# Patient Record
Sex: Male | Born: 1960 | Race: Black or African American | Hispanic: No | Smoking: Current every day smoker
Health system: Southern US, Community
[De-identification: ages and names within clinical notes are randomized; demographics above are authoritative.]

## PROBLEM LIST (undated history)

## (undated) DIAGNOSIS — J45909 Unspecified asthma, uncomplicated: Secondary | ICD-10-CM

## (undated) DIAGNOSIS — E119 Type 2 diabetes mellitus without complications: Secondary | ICD-10-CM

## (undated) HISTORY — PX: TOE AMPUTATION: SHX809

---

## 2014-09-27 ENCOUNTER — Encounter (HOSPITAL_COMMUNITY): Payer: Self-pay | Admitting: Emergency Medicine

## 2014-09-27 ENCOUNTER — Emergency Department (INDEPENDENT_AMBULATORY_CARE_PROVIDER_SITE_OTHER)
Admission: EM | Admit: 2014-09-27 | Discharge: 2014-09-27 | Disposition: A | Discharge status: Home / Self Care | Payer: PRIVATE HEALTH INSURANCE | Source: Home / Self Care | Attending: Emergency Medicine | Admitting: Emergency Medicine

## 2014-09-27 DIAGNOSIS — L03032 Cellulitis of left toe: Secondary | ICD-10-CM

## 2014-09-27 HISTORY — DX: Type 2 diabetes mellitus without complications: E11.9

## 2014-09-27 MED ORDER — SULFAMETHOXAZOLE-TRIMETHOPRIM 800-160 MG PO TABS: 2.0000 | ORAL_TABLET | Freq: Two times a day (BID) | ORAL | Status: AC

## 2014-09-27 MED ORDER — CIPROFLOXACIN HCL 750 MG PO TABS: 750.0000 mg | ORAL_TABLET | Freq: Two times a day (BID) | ORAL | Status: DC

## 2014-09-27 MED ORDER — TRAMADOL HCL 50 MG PO TABS: 50.0000 mg | ORAL_TABLET | Freq: Four times a day (QID) | ORAL | Status: DC | PRN

## 2014-09-27 NOTE — ED Provider Notes (Signed)
CSN: [id number]     Arrival date & time 09/27/14  [id number] History   First MD Initiated Contact with Patient 09/27/14 [phone]     Chief Complaint  Patient presents with  . Toe Injury   (Consider location/radiation/quality/duration/timing/severity/associated sxs/prior Treatment) HPI  Marc Miranda is a 54 year old man here for left toe injury. 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 Marc Miranda states over the last 3-4 days Marc Miranda has noticed a blister on the left second toe. Last night, it in the shower the blister ruptured and drained blood and pus. Marc Miranda has also noticed some redness and swelling of the second toe and the medial part of his foot. Marc Miranda denies any fevers or vomiting. Marc Miranda is here from [place], where Marc Miranda does have a doctor and an appointment with podiatry coming up.  Past Medical History  Diagnosis Date  . Diabetes mellitus without complication    History reviewed. No pertinent past surgical history. History reviewed. No pertinent family history. History  Substance Use Topics  . Smoking status: Current Every Day Smoker -- 0.50 packs/day    Types: Cigarettes  . Smokeless tobacco: Not on file  . Alcohol Use: No    Review of Systems As in history of present illness Allergies  Review of patient's allergies indicates no known allergies.  Home Medications   Prior to Admission medications   Medication Sig Start Date End Date Taking? Authorizing Provider  lisinopril (PRINIVIL,ZESTRIL) 10 MG tablet Take 10 mg by mouth daily.   Yes Historical Provider, MD  metFORMIN (GLUCOPHAGE) 1000 MG tablet Take 1,000 mg by mouth 2 (two) times daily with a meal.   Yes Historical Provider, MD  ciprofloxacin (CIPRO) 750 MG tablet Take 1 tablet (750 mg total) by mouth 2 (two) times daily. 09/27/14   Charm RingsErin J Trenell Concannon, MD  sulfamethoxazole-trimethoprim (BACTRIM DS,SEPTRA DS) 800-160 MG per tablet Take 2 tablets by mouth 2 (two) times daily. 09/27/14 10/04/14  Charm RingsErin J Jonta Gastineau, MD  traMADol (ULTRAM) 50 MG tablet Take 1  tablet (50 mg total) by mouth every 6 (six) hours as needed. 09/27/14   Charm RingsErin J Kyrollos Cordell, MD   BP 108/71 mmHg  Pulse 84  Temp(Src) 98.2 F (36.8 C) (Oral)  Resp 16  SpO2 99% Physical Exam  Constitutional: Marc Miranda is oriented to person, place, and time. Marc Miranda appears well-developed and well-nourished. No distress.  Cardiovascular: Normal rate.   Pulmonary/Chest: Effort normal.  Musculoskeletal:  Left foot: Great toe amputation. Second toe is erythematous, swollen, warm. There is a chronic appearing ulcer at the tip of the second toe. Marc Miranda has an open wound on the dorsal aspect of the second toe. It is not actively draining at this time. The erythema extends over the second metatarsal area.  Neurological: Marc Miranda is alert and oriented to person, place, and time.    ED Course  Procedures (including critical care time) Labs Review Labs Reviewed - No data to display  Imaging Review No results found.   MDM   1. Cellulitis of second toe of left foot    Given his diabetes, will cover with Bactrim and Cipro. Tramadol for pain. Wound care instructions as in after visit summary. Strict return precautions reviewed as in after visit summary.    Charm RingsErin J Koralynn Greenspan, MD 09/27/14 [phone]

## 2014-09-27 NOTE — ED Notes (Signed)
Pt states that he has a blister on his great left toe that busted yesterday when he was in the shower.

## 2014-09-27 NOTE — Discharge Instructions (Signed)
You have an infection in your toe. Take Bactrim 2 pills twice a day for the next 10 days. Take Cipro 1 pill twice a day for the next 10 days. Wash the toe and foot with soap and warm water twice a day.  Apply Neosporin and cover with a bandage for the next 3 days. If it is not improving in the next 2 days, or the redness is spreading to your ankle and leg, or you develop fevers, please go to the emergency room. Follow-up with your doctor when you get back to [place].

## 2014-10-15 ENCOUNTER — Emergency Department (HOSPITAL_COMMUNITY)
Admission: EM | Admit: 2014-10-15 | Discharge: 2014-10-15 | Disposition: A | Discharge status: Home / Self Care | Payer: Medicaid - Out of State | Attending: Emergency Medicine | Admitting: Emergency Medicine

## 2014-10-15 ENCOUNTER — Encounter (HOSPITAL_COMMUNITY): Payer: Self-pay | Admitting: Emergency Medicine

## 2014-10-15 DIAGNOSIS — M545 Low back pain: Secondary | ICD-10-CM | POA: Diagnosis present

## 2014-10-15 DIAGNOSIS — G8911 Acute pain due to trauma: Secondary | ICD-10-CM | POA: Diagnosis not present

## 2014-10-15 DIAGNOSIS — Z720 Tobacco use: Secondary | ICD-10-CM | POA: Diagnosis not present

## 2014-10-15 DIAGNOSIS — Z792 Long term (current) use of antibiotics: Secondary | ICD-10-CM | POA: Diagnosis not present

## 2014-10-15 DIAGNOSIS — Z79899 Other long term (current) drug therapy: Secondary | ICD-10-CM | POA: Diagnosis not present

## 2014-10-15 DIAGNOSIS — M5432 Sciatica, left side: Secondary | ICD-10-CM | POA: Insufficient documentation

## 2014-10-15 DIAGNOSIS — E119 Type 2 diabetes mellitus without complications: Secondary | ICD-10-CM | POA: Insufficient documentation

## 2014-10-15 MED ORDER — IBUPROFEN 800 MG PO TABS: 800.0000 mg | ORAL_TABLET | Freq: Three times a day (TID) | ORAL | Status: AC

## 2014-10-15 MED ORDER — CYCLOBENZAPRINE HCL 10 MG PO TABS: 10.0000 mg | ORAL_TABLET | Freq: Two times a day (BID) | ORAL | Status: DC | PRN
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
## 2014-10-15 NOTE — Discharge Instructions (Signed)
Sciatica °Sciatica is pain, weakness, numbness, or tingling along your sciatic nerve. The nerve starts in the lower back and runs down the back of each leg. Nerve damage or certain conditions pinch or put pressure on the sciatic nerve. This causes the pain, weakness, and other discomforts of sciatica. °HOME CARE  °· Only take medicine as told by your doctor. °· Apply ice to the affected area for 20 minutes. Do this 3-4 times a day for the first 48-72 hours. Then try heat in the same way. °· Exercise, stretch, or do your usual activities if these do not make your pain worse. °· Go to physical therapy as told by your doctor. °· Keep all doctor visits as told. °· Do not wear high heels or shoes that are not supportive. °· Get a firm mattress if your mattress is too soft to lessen pain and discomfort. °GET HELP RIGHT AWAY IF:  °· You cannot control when you poop (bowel movement) or pee (urinate). °· You have more weakness in your lower back, lower belly (pelvis), butt (buttocks), or legs. °· You have redness or puffiness (swelling) of your back. °· You have a burning feeling when you pee. °· You have pain that gets worse when you lie down. °· You have pain that wakes you from your sleep. °· Your pain is worse than past pain. °· Your pain lasts longer than 4 weeks. °· You are suddenly losing weight without reason. °MAKE SURE YOU:  °· Understand these instructions. °· Will watch this condition. °· Will get help right away if you are not doing well or get worse. °Document Released: 01/27/2008 Document Revised: 10/19/2011 Document Reviewed: 08/29/2011 °Exit[employer]® Patient Information ©2015 [employer]. This information is not intended to replace advice given to you by your health care provider. Make sure you discuss any questions you have with your health care provider. ° °
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
## 2014-12-03 ENCOUNTER — Other Ambulatory Visit (HOSPITAL_COMMUNITY): Payer: Self-pay | Admitting: Orthopedic Surgery

## 2014-12-03 ENCOUNTER — Inpatient Hospital Stay (HOSPITAL_COMMUNITY)
Admission: EM | Admit: 2014-12-03 | Discharge: 2014-12-05 | DRG: 617 | Disposition: A | Discharge status: Home / Self Care | Payer: Medicaid - Out of State | Attending: Family Medicine | Admitting: Family Medicine

## 2014-12-03 ENCOUNTER — Encounter (HOSPITAL_COMMUNITY): Payer: Self-pay | Admitting: Emergency Medicine

## 2014-12-03 ENCOUNTER — Ambulatory Visit (HOSPITAL_COMMUNITY): Payer: Medicaid - Out of State

## 2014-12-03 ENCOUNTER — Emergency Department (HOSPITAL_COMMUNITY): Payer: Medicaid - Out of State

## 2014-12-03 DIAGNOSIS — E11621 Type 2 diabetes mellitus with foot ulcer: Principal | ICD-10-CM | POA: Diagnosis present

## 2014-12-03 DIAGNOSIS — E1142 Type 2 diabetes mellitus with diabetic polyneuropathy: Secondary | ICD-10-CM

## 2014-12-03 DIAGNOSIS — Z794 Long term (current) use of insulin: Secondary | ICD-10-CM

## 2014-12-03 DIAGNOSIS — E1169 Type 2 diabetes mellitus with other specified complication: Secondary | ICD-10-CM | POA: Diagnosis present

## 2014-12-03 DIAGNOSIS — L97529 Non-pressure chronic ulcer of other part of left foot with unspecified severity: Secondary | ICD-10-CM | POA: Diagnosis present

## 2014-12-03 DIAGNOSIS — M869 Osteomyelitis, unspecified: Secondary | ICD-10-CM | POA: Diagnosis present

## 2014-12-03 DIAGNOSIS — J45909 Unspecified asthma, uncomplicated: Secondary | ICD-10-CM | POA: Diagnosis present

## 2014-12-03 DIAGNOSIS — M7732 Calcaneal spur, left foot: Secondary | ICD-10-CM | POA: Diagnosis present

## 2014-12-03 DIAGNOSIS — L03032 Cellulitis of left toe: Secondary | ICD-10-CM | POA: Diagnosis present

## 2014-12-03 DIAGNOSIS — I10 Essential (primary) hypertension: Secondary | ICD-10-CM | POA: Diagnosis present

## 2014-12-03 DIAGNOSIS — L03119 Cellulitis of unspecified part of limb: Secondary | ICD-10-CM | POA: Diagnosis not present

## 2014-12-03 DIAGNOSIS — E1140 Type 2 diabetes mellitus with diabetic neuropathy, unspecified: Secondary | ICD-10-CM | POA: Diagnosis present

## 2014-12-03 DIAGNOSIS — F17210 Nicotine dependence, cigarettes, uncomplicated: Secondary | ICD-10-CM | POA: Diagnosis present

## 2014-12-03 DIAGNOSIS — L0390 Cellulitis, unspecified: Secondary | ICD-10-CM | POA: Diagnosis present

## 2014-12-03 DIAGNOSIS — I96 Gangrene, not elsewhere classified: Secondary | ICD-10-CM | POA: Diagnosis present

## 2014-12-03 DIAGNOSIS — Z899 Acquired absence of limb, unspecified: Secondary | ICD-10-CM | POA: Diagnosis not present

## 2014-12-03 DIAGNOSIS — E119 Type 2 diabetes mellitus without complications: Secondary | ICD-10-CM

## 2014-12-03 DIAGNOSIS — L97509 Non-pressure chronic ulcer of other part of unspecified foot with unspecified severity: Secondary | ICD-10-CM

## 2014-12-03 HISTORY — DX: Unspecified asthma, uncomplicated: J45.909

## 2014-12-03 LAB — CBC WITH DIFFERENTIAL/PLATELET
BASOS ABS: 0.1 10*3/uL (ref 0.0–0.1)
Basophils Absolute: 0.1 10*3/uL (ref 0.0–0.1)
Basophils Relative: 2 % — ABNORMAL HIGH (ref 0–1)
Basophils Relative: 2 % — ABNORMAL HIGH (ref 0–1)
Eosinophils Absolute: 0.2 10*3/uL (ref 0.0–0.7)
Eosinophils Absolute: 0.2 10*3/uL (ref 0.0–0.7)
Eosinophils Relative: 3 % (ref 0–5)
Eosinophils Relative: 4 % (ref 0–5)
HCT: 37.1 % — ABNORMAL LOW (ref 39.0–52.0)
HEMATOCRIT: 35.4 % — AB (ref 39.0–52.0)
HEMOGLOBIN: 12.4 g/dL — AB (ref 13.0–17.0)
Hemoglobin: 11.3 g/dL — ABNORMAL LOW (ref 13.0–17.0)
LYMPHS ABS: 2.7 10*3/uL (ref 0.7–4.0)
LYMPHS ABS: 2.6 10*3/uL (ref 0.7–4.0)
LYMPHS PCT: 43 % (ref 12–46)
Lymphocytes Relative: 38 % (ref 12–46)
MCH: 23.2 pg — ABNORMAL LOW (ref 26.0–34.0)
MCH: 22.3 pg — AB (ref 26.0–34.0)
MCHC: 33.4 g/dL (ref 30.0–36.0)
MCHC: 31.9 g/dL (ref 30.0–36.0)
MCV: 69.5 fL — ABNORMAL LOW (ref 78.0–100.0)
MCV: 70 fL — AB (ref 78.0–100.0)
Monocytes Absolute: 0.6 10*3/uL (ref 0.1–1.0)
Monocytes Absolute: 0.5 10*3/uL (ref 0.1–1.0)
Monocytes Relative: 9 % (ref 3–12)
Monocytes Relative: 9 % (ref 3–12)
NEUTROS PCT: 48 % (ref 43–77)
Neutro Abs: 3.5 10*3/uL (ref 1.7–7.7)
Neutro Abs: 2.7 10*3/uL (ref 1.7–7.7)
Neutrophils Relative %: 42 % — ABNORMAL LOW (ref 43–77)
PLATELETS: 168 10*3/uL (ref 150–400)
Platelets: 170 10*3/uL (ref 150–400)
RBC: 5.34 MIL/uL (ref 4.22–5.81)
RBC: 5.06 MIL/uL (ref 4.22–5.81)
RDW: 14.7 % (ref 11.5–15.5)
RDW: 14.8 % (ref 11.5–15.5)
WBC: 7.1 10*3/uL (ref 4.0–10.5)
WBC: 6.1 10*3/uL (ref 4.0–10.5)

## 2014-12-03 LAB — GLUCOSE, CAPILLARY
GLUCOSE-CAPILLARY: 294 mg/dL — AB (ref 65–99)
Glucose-Capillary: 311 mg/dL — ABNORMAL HIGH (ref 65–99)
Glucose-Capillary: 249 mg/dL — ABNORMAL HIGH (ref 65–99)
Glucose-Capillary: 408 mg/dL — ABNORMAL HIGH (ref 65–99)

## 2014-12-03 LAB — HEPATIC FUNCTION PANEL
ALT: 47 U/L (ref 17–63)
AST: 27 U/L (ref 15–41)
Albumin: 3.1 g/dL — ABNORMAL LOW (ref 3.5–5.0)
Alkaline Phosphatase: 95 U/L (ref 38–126)
Bilirubin, Direct: <0.1 mg/dL — ABNORMAL LOW (ref 0.1–0.5)
Total Bilirubin: 0.4 mg/dL (ref 0.3–1.2)
Total Protein: 7.3 g/dL (ref 6.5–8.1)

## 2014-12-03 LAB — BASIC METABOLIC PANEL
Anion gap: 8 (ref 5–15)
BUN: 21 mg/dL — ABNORMAL HIGH (ref 6–20)
CO2: 25 mmol/L (ref 22–32)
Calcium: 9.1 mg/dL (ref 8.9–10.3)
Chloride: 100 mmol/L — ABNORMAL LOW (ref 101–111)
Creatinine, Ser: 1.15 mg/dL (ref 0.61–1.24)
GFR calc Af Amer: >60 mL/min (ref >60)
GFR calc non Af Amer: >60 mL/min (ref >60)
Glucose, Bld: 456 mg/dL — ABNORMAL HIGH (ref 65–99)
Potassium: 5.2 mmol/L — ABNORMAL HIGH (ref 3.5–5.1)
SODIUM: 133 mmol/L — AB (ref 135–145)

## 2014-12-03 LAB — CREATININE, SERUM
Creatinine, Ser: 1 mg/dL (ref 0.61–1.24)
GFR calc Af Amer: >60 mL/min (ref >60)
GFR calc non Af Amer: >60 mL/min (ref >60)

## 2014-12-03 LAB — HIV ANTIBODY (ROUTINE TESTING W REFLEX): HIV Screen 4th Generation wRfx: NONREACTIVE

## 2014-12-03 LAB — COMPREHENSIVE METABOLIC PANEL
ALK PHOS: 75 U/L (ref 38–126)
ALT: 44 U/L (ref 17–63)
AST: 24 U/L (ref 15–41)
Albumin: 2.9 g/dL — ABNORMAL LOW (ref 3.5–5.0)
Anion gap: 6 (ref 5–15)
BILIRUBIN TOTAL: 0.3 mg/dL (ref 0.3–1.2)
BUN: 16 mg/dL (ref 6–20)
CO2: 28 mmol/L (ref 22–32)
CREATININE: 1.09 mg/dL (ref 0.61–1.24)
Calcium: 9 mg/dL (ref 8.9–10.3)
Chloride: 102 mmol/L (ref 101–111)
GFR calc Af Amer: >60 mL/min (ref >60)
GFR calc non Af Amer: >60 mL/min (ref >60)
Glucose, Bld: 387 mg/dL — ABNORMAL HIGH (ref 65–99)
POTASSIUM: 4.8 mmol/L (ref 3.5–5.1)
SODIUM: 136 mmol/L (ref 135–145)
Total Protein: 6.5 g/dL (ref 6.5–8.1)

## 2014-12-03 LAB — PREALBUMIN: Prealbumin: 20.8 mg/dL (ref 18–38)

## 2014-12-03 LAB — PROTIME-INR
INR: 1.04 (ref 0.00–1.49)
INR: 1.11 (ref 0.00–1.49)
Prothrombin Time: 13.8 seconds (ref 11.6–15.2)
Prothrombin Time: 14.5 seconds (ref 11.6–15.2)

## 2014-12-03 LAB — TYPE AND SCREEN
ABO/RH(D): O NEG
Antibody Screen: NEGATIVE

## 2014-12-03 LAB — CK: Total CK: 221 U/L (ref 49–397)

## 2014-12-03 LAB — SEDIMENTATION RATE: Sed Rate: 49 mm/hr — ABNORMAL HIGH (ref 0–16)

## 2014-12-03 LAB — ABO/RH: ABO/RH(D): O NEG

## 2014-12-03 LAB — C-REACTIVE PROTEIN: CRP: 7.1 mg/dL — ABNORMAL HIGH (ref <1.0)

## 2014-12-03 MED ORDER — PRO-STAT SUGAR FREE PO LIQD
30.0000 mL | Freq: Every day | ORAL | Status: DC
  Administered 2014-12-03 – 2014-12-05 (×2): 30 mL via ORAL
  Filled 2014-12-03 (×2): qty 30

## 2014-12-03 MED ORDER — VANCOMYCIN HCL 10 G IV SOLR
1500.0000 mg | Freq: Once | INTRAVENOUS | Status: AC
  Administered 2014-12-04: 1500 mg via INTRAVENOUS
  Filled 2014-12-03 (×2): qty 1500

## 2014-12-03 MED ORDER — INSULIN ASPART 100 UNIT/ML ~~LOC~~ SOLN
0.0000 [IU] | Freq: Three times a day (TID) | SUBCUTANEOUS | Status: DC
  Administered 2014-12-03: 5 [IU] via SUBCUTANEOUS
  Administered 2014-12-03: 8 [IU] via SUBCUTANEOUS
  Administered 2014-12-03: 11 [IU] via SUBCUTANEOUS
  Administered 2014-12-04: 5 [IU] via SUBCUTANEOUS
  Administered 2014-12-04: 8 [IU] via SUBCUTANEOUS
  Administered 2014-12-05: 3 [IU] via SUBCUTANEOUS
  Administered 2014-12-05: 5 [IU] via SUBCUTANEOUS

## 2014-12-03 MED ORDER — ACETAMINOPHEN 650 MG RE SUPP: 650.0000 mg | Freq: Four times a day (QID) | RECTAL | Status: DC | PRN

## 2014-12-03 MED ORDER — SODIUM POLYSTYRENE SULFONATE 15 GM/60ML PO SUSP
30.0000 g | Freq: Once | ORAL | Status: AC
  Administered 2014-12-03: 30 g via ORAL
  Filled 2014-12-03: qty 120

## 2014-12-03 MED ORDER — PIPERACILLIN-TAZOBACTAM 3.375 G IVPB 30 MIN
3.3750 g | INTRAVENOUS | Status: AC
  Filled 2014-12-03: qty 50

## 2014-12-03 MED ORDER — HYDROMORPHONE HCL 1 MG/ML IJ SOLN
0.5000 mg | INTRAMUSCULAR | Status: DC | PRN
  Administered 2014-12-03 – 2014-12-05 (×8): 0.5 mg via INTRAVENOUS
  Filled 2014-12-03 (×8): qty 1

## 2014-12-03 MED ORDER — ONDANSETRON HCL 4 MG/2ML IJ SOLN
4.0000 mg | Freq: Four times a day (QID) | INTRAMUSCULAR | Status: DC | PRN
  Administered 2014-12-04: 4 mg via INTRAVENOUS

## 2014-12-03 MED ORDER — INSULIN ASPART 100 UNIT/ML ~~LOC~~ SOLN
0.0000 [IU] | Freq: Every day | SUBCUTANEOUS | Status: DC
  Administered 2014-12-03: 5 [IU] via SUBCUTANEOUS
  Administered 2014-12-04: 3 [IU] via SUBCUTANEOUS

## 2014-12-03 MED ORDER — OXYCODONE HCL 5 MG PO TABS
5.0000 mg | ORAL_TABLET | ORAL | Status: DC | PRN
  Administered 2014-12-03 – 2014-12-05 (×6): 5 mg via ORAL
  Filled 2014-12-03 (×7): qty 1

## 2014-12-03 MED ORDER — ONDANSETRON HCL 4 MG PO TABS
4.0000 mg | ORAL_TABLET | Freq: Four times a day (QID) | ORAL | Status: DC | PRN
Start: 2014-12-03 — End: 2014-12-04

## 2014-12-03 MED ORDER — HYDROCODONE-ACETAMINOPHEN 10-325 MG PO TABS
1.0000 | ORAL_TABLET | Freq: Four times a day (QID) | ORAL | Status: DC | PRN
  Administered 2014-12-03: 2 via ORAL
  Filled 2014-12-03: qty 2

## 2014-12-03 MED ORDER — PANTOPRAZOLE SODIUM 40 MG PO TBEC
40.0000 mg | DELAYED_RELEASE_TABLET | Freq: Every day | ORAL | Status: DC
  Administered 2014-12-03 – 2014-12-05 (×3): 40 mg via ORAL
  Filled 2014-12-03 (×3): qty 1

## 2014-12-03 MED ORDER — INSULIN GLARGINE 100 UNIT/ML ~~LOC~~ SOLN
60.0000 [IU] | Freq: Every day | SUBCUTANEOUS | Status: DC
  Administered 2014-12-03 – 2014-12-04 (×2): 60 [IU] via SUBCUTANEOUS
  Filled 2014-12-03 (×3): qty 0.6

## 2014-12-03 MED ORDER — ENOXAPARIN SODIUM 40 MG/0.4ML ~~LOC~~ SOLN
40.0000 mg | SUBCUTANEOUS | Status: DC
  Administered 2014-12-03 – 2014-12-05 (×2): 40 mg via SUBCUTANEOUS
  Filled 2014-12-03 (×2): qty 0.4

## 2014-12-03 MED ORDER — GLUCERNA SHAKE PO LIQD
237.0000 mL | Freq: Three times a day (TID) | ORAL | Status: DC
  Administered 2014-12-03 – 2014-12-05 (×5): 237 mL via ORAL

## 2014-12-03 MED ORDER — ACETAMINOPHEN 325 MG PO TABS: 650.0000 mg | ORAL_TABLET | Freq: Four times a day (QID) | ORAL | Status: DC | PRN

## 2014-12-03 MED ORDER — PIPERACILLIN-TAZOBACTAM 4.5 G IVPB: 4.5000 g | Freq: Once | INTRAVENOUS | Status: DC

## 2014-12-03 NOTE — Progress Notes (Signed)
Initial Nutrition Assessment  DOCUMENTATION CODES:   Obesity unspecified  INTERVENTION:  Provide Glucerna Shake po TID, each supplement provides 220 kcal and 10 grams of protein.  Provide 30 ml Prostat po once daily, each supplement  Provides 100 kcal and 15 grams of protein.   NUTRITION DIAGNOSIS:   Increased nutrient needs related to wound healing as evidenced by estimated needs.  GOAL:   Patient will meet greater than or equal to 90% of their needs  MONITOR:   PO intake, Supplement acceptance, Weight trends, Labs, I & O's  REASON FOR ASSESSMENT:   Consult Wound healing  ASSESSMENT:   54 y.o. male with Past medical history of diabetes mellitus, hypertension, anemia, neuropathy, amputation of the left great toe. The patient is presenting with complaints of nonhealing ulcer of the left second and third toe.  Pt reports having a decreased appetite since admission. No percent meal completion recorded, however pt reports a 50% intake. PTA pt reports eating well with 3 meals daily and no other difficulties. Pt is agreeable to nutritional supplements to aid in wound healing. RD to order. Pt was encouraged to eat his food at meals.  Pt with no observed significant fat or muscle mass loss.  Labs and medications reviewed.   Diet Order:  Diet heart healthy/carb modified Room service appropriate?: Yes; Fluid consistency:: Thin  Skin:  Wound (see comment) (on L toe, +2 LLE edema)  Last BM:  7/29  Height:   Ht Readings from Last 1 Encounters:  12/03/14  (1.93 m)    Weight:   Wt Readings from Last 1 Encounters:  12/03/14 295 lb (133.811 kg)    Ideal Body Weight:  91.8 kg  BMI:  Body mass index is 35.92 kg/(m^2).  Estimated Nutritional Needs:   Kcal:  2300-2500  Protein:  130-145 grams  Fluid:  2.3 - 2.5 L/day  EDUCATION NEEDS:   No education needs identified at this time  Roslyn Smiling, MS, RD, LDN Pager # [phone] After hours/ weekend pager #  [phone]

## 2014-12-03 NOTE — Progress Notes (Signed)
                PATIENT DETAILS Name: Marc Miranda Age: 54 y.o. Sex: male Date of Birth: 10-Sep-1960 Admit Date: 12/03/2014 Admitting Physician Rolly Salter, MD PCP:No PCP Per Patient  Patient seen and examined. Admitted earlier this morning by Dr. Allena Katz. Agree with plans outlined by Dr. Allena Katz. Have consulted Dr. Alfredo Batty patient would likely need an amputation of his second toe of his left foot. In the meantime continue with wet-to-dry dressings, continue empiric antibiotics.  Windell Norfolk MD

## 2014-12-03 NOTE — ED Notes (Signed)
Pt. reports worsening left middle toe infection with drainage and swelling onset last week , denies fever or chills.

## 2014-12-03 NOTE — ED Notes (Signed)
Admitting at bedside 

## 2014-12-03 NOTE — ED Notes (Signed)
Attempted report x1. 

## 2014-12-03 NOTE — Consult Note (Signed)
Reason for Consult: Osteomyelitis ulceration and cellulitis left forefoot Referring Physician: Dr Jari Favre is an 54 y.o. male.  HPI: Patient is a 54 year old gentleman diabetic insensate neuropathy who presents with ulceration necrosis cellulitis left foot third toe   Past Medical History  Diagnosis Date  . Diabetes mellitus without complication   . Asthma     Past Surgical History  Procedure Laterality Date  . Toe amputation      History reviewed. No pertinent family history.  Social History:  reports that he has been smoking Cigarettes.  He has a 10 pack-year smoking history. He has never used smokeless tobacco. He reports that he does not drink alcohol or use illicit drugs.  Allergies: No Known Allergies  Medications: I have reviewed the patient's current medications.  Results for orders placed or performed during the hospital encounter of 12/03/14 (from the past 48 hour(s))  Sedimentation rate     Status: Abnormal   Collection Time: 12/03/14  1:52 AM  Result Value Ref Range   Sed Rate 49 (H) 0 - 16 mm/hr  C-reactive protein     Status: Abnormal   Collection Time: 12/03/14  1:52 AM  Result Value Ref Range   CRP 7.1 (H) <1.0 mg/dL  CBC with Differential/Platelet     Status: Abnormal   Collection Time: 12/03/14  1:52 AM  Result Value Ref Range   WBC 7.1 4.0 - 10.5 K/uL   RBC 5.34 4.22 - 5.81 MIL/uL   Hemoglobin 12.4 (L) 13.0 - 17.0 g/dL   HCT 37.1 (L) 39.0 - 52.0 %   MCV 69.5 (L) 78.0 - 100.0 fL   MCH 23.2 (L) 26.0 - 34.0 pg   MCHC 33.4 30.0 - 36.0 g/dL   RDW 14.7 11.5 - 15.5 %   Platelets 170 150 - 400 K/uL   Neutrophils Relative % 48 43 - 77 %   Lymphocytes Relative 38 12 - 46 %   Monocytes Relative 9 3 - 12 %   Eosinophils Relative 3 0 - 5 %   Basophils Relative 2 (H) 0 - 1 %   Neutro Abs 3.5 1.7 - 7.7 K/uL   Lymphs Abs 2.7 0.7 - 4.0 K/uL   Monocytes Absolute 0.6 0.1 - 1.0 K/uL   Eosinophils Absolute 0.2 0.0 - 0.7 K/uL   Basophils Absolute 0.1  0.0 - 0.1 K/uL   RBC Morphology TEARDROP CELLS     Comment: ELLIPTOCYTES   WBC Morphology ATYPICAL LYMPHOCYTES   Basic metabolic panel     Status: Abnormal   Collection Time: 12/03/14  1:52 AM  Result Value Ref Range   Sodium 133 (L) 135 - 145 mmol/L   Potassium 5.2 (H) 3.5 - 5.1 mmol/L   Chloride 100 (L) 101 - 111 mmol/L   CO2 25 22 - 32 mmol/L   Glucose, Bld 456 (H) 65 - 99 mg/dL   BUN 21 (H) 6 - 20 mg/dL   Creatinine, Ser 1.15 0.61 - 1.24 mg/dL   Calcium 9.1 8.9 - 10.3 mg/dL   GFR calc non Af Amer >60 >60 mL/min   GFR calc Af Amer >60 >60 mL/min    Comment: (NOTE) The eGFR has been calculated using the CKD EPI equation. This calculation has not been validated in all clinical situations. eGFR's persistently <60 mL/min signify possible Chronic Kidney Disease.    Anion gap 8 5 - 15  HIV antibody     Status: None   Collection Time: 12/03/14  7:15 AM  Result Value Ref Range   HIV Screen 4th Generation wRfx Non Reactive Non Reactive    Comment: (NOTE) Performed At: [facility] [place], [place] [id number] Lindon Romp MD WN:[id number]   Prealbumin     Status: None   Collection Time: 12/03/14  7:15 AM  Result Value Ref Range   Prealbumin 20.8 18 - 38 mg/dL  Blood Cultures x 2 sites     Status: None (Preliminary result)   Collection Time: 12/03/14  7:15 AM  Result Value Ref Range   Specimen Description BLOOD RIGHT ARM    Special Requests BOTTLES DRAWN AEROBIC AND ANAEROBIC 10CC    Culture PENDING    Report Status PENDING   Creatinine, serum     Status: None   Collection Time: 12/03/14  7:15 AM  Result Value Ref Range   Creatinine, Ser 1.00 0.61 - 1.24 mg/dL   GFR calc non Af Amer >60 >60 mL/min   GFR calc Af Amer >60 >60 mL/min    Comment: (NOTE) The eGFR has been calculated using the CKD EPI equation. This calculation has not been validated in all clinical situations. eGFR's persistently <60 mL/min signify possible Chronic Kidney Disease.    LFT's     Status: Abnormal   Collection Time: 12/03/14  7:15 AM  Result Value Ref Range   Total Protein 7.3 6.5 - 8.1 g/dL   Albumin 3.1 (L) 3.5 - 5.0 g/dL   AST 27 15 - 41 U/L   ALT 47 17 - 63 U/L   Alkaline Phosphatase 95 38 - 126 U/L   Total Bilirubin 0.4 0.3 - 1.2 mg/dL   Bilirubin, Direct <0.1 (L) 0.1 - 0.5 mg/dL   Indirect Bilirubin NOT CALCULATED 0.3 - 0.9 mg/dL  Blood Cultures x 2 sites     Status: None (Preliminary result)   Collection Time: 12/03/14  7:23 AM  Result Value Ref Range   Specimen Description BLOOD RIGHT ARM    Special Requests BOTTLES DRAWN AEROBIC AND ANAEROBIC 10CC    Culture PENDING    Report Status PENDING   CK     Status: None   Collection Time: 12/03/14  7:23 AM  Result Value Ref Range   Total CK 221 49 - 397 U/L  Protime-INR     Status: None   Collection Time: 12/03/14  7:23 AM  Result Value Ref Range   Prothrombin Time 13.8 11.6 - 15.2 seconds   INR 1.04 0.00 - 1.49  Glucose, capillary     Status: Abnormal   Collection Time: 12/03/14  7:42 AM  Result Value Ref Range   Glucose-Capillary 294 (H) 65 - 99 mg/dL  Glucose, capillary     Status: Abnormal   Collection Time: 12/03/14 12:27 PM  Result Value Ref Range   Glucose-Capillary 311 (H) 65 - 99 mg/dL  Glucose, capillary     Status: Abnormal   Collection Time: 12/03/14  5:34 PM  Result Value Ref Range   Glucose-Capillary 249 (H) 65 - 99 mg/dL    Dg Foot Complete Left  12/03/2014   CLINICAL DATA:  Acute onset of left foot pain and erythema. Initial encounter.  EXAM: LEFT FOOT - COMPLETE 3+ VIEW  COMPARISON:  None.  FINDINGS: The patient is status post amputation of much of the first metatarsal. Degenerative change is noted at the second and third metatarsophalangeal joints. There is chronic resorption involving the distal aspects of the second and third toes. Would correlate clinically for any evidence of  underlying osteomyelitis. Associated soft tissue swelling is noted about the second and third  toes.  The visualized joint spaces are grossly preserved. An os peroneum is noted. Plantar and posterior calcaneal spurs are incidentally seen.  IMPRESSION: 1. Chronic resorption involving the distal aspects of the second and third toes. Would correlate clinically for any evidence of underlying osteomyelitis. 2. Status post amputation of much of the first metatarsal. Degenerative change at the second and third metatarsophalangeal joints. 3. Os peroneum noted.   Electronically Signed   By: Garald Balding M.D.   On: 12/03/2014 02:55    Review of Systems  All other systems reviewed and are negative.  Blood pressure 120/80, pulse 72, temperature 97.8 F (36.6 C), temperature source Oral, resp. rate 18, height $RemoveBe'6\' 4"'KWKaRbcOI$  (1.93 m), weight 133.811 kg (295 lb), SpO2 98 %. Physical Exam On examination patient is a good dorsalis pedis pulse on the left. He has swelling involving the foot and leg secondary to the forefoot infection. There is cellulitis involving the entire forefoot with necrotic gangrenous third toe with exposed bone. Patient is also status post a left foot first ray amputation. He has fixed clawing of the lesser toes. Assessment/Plan: Assessment: Cellulitis ulceration osteomyelitis left forefoot status post first ray amputation with fixed clawing of his toes. I discussed the patient feel is best option is a transmetatarsal amputation. I do not feel there is sufficient soft tissue envelope to heal an amputation of the third toe and with the fixed clawing of the second and fourth toes patient would quickly break these down and require additional surgery. I am concerned with the cellulitis extending into the MTP joint region and the infection may extend more proximal than it appears. Discussed that we will try to save as much of his foot as we can with a transmetatarsal amputation. Risks and benefits were discussed including the risk of the wound not healing. Patient states he understands wishes to proceed  at this time.  Ronon Ferger V 12/03/2014, 6:01 PM

## 2014-12-03 NOTE — Consult Note (Signed)
WOC consult requested for left foot, but EMR indicates that ortho service has been consulted and their recommendations are pending. X-ray indicates osteomyelitis; this complex medical condition is beyond WOC scope of practice.  Please refer to ortho service for further questions. Please re-consult if further assistance is needed.  Thank-you,  Cammie Mcgee MSN, RN, CWOCN, [place], CNS [phone]

## 2014-12-03 NOTE — ED Provider Notes (Signed)
CSN: [id number]     Arrival date & time 12/03/14  0112 History   This chart was scribed for Marisa Severin, MD by Doreatha Martin, ED Scribe. This patient was seen in room D32C/D32C and the patient's care was started at 2:13 AM.     Chief Complaint  Patient presents with  . Wound Infection   The history is provided by the patient. No language interpreter was used.    HPI Comments: Atreyu Mak is a 54 y.o. male with Hx of DM, asthma who presents to the Emergency Department complaining of a moderate, gradually worsening wound infection on the left middle toe onset 4 days ago. He states associated fever 4 days ago, pain, redness and swelling. Pt states that he noticed the open wound after a shower 4 days ago while in [place], [place] and reports that the wound worsened later that day. He was then seen at [facility] in [place] where he was admitted for 3 days, Dx with osteomyelitis of the left middle toe and prescribed Cipro, clinda, Zosyn and Vancomycin. Pt states that he lives in [place]. He has a Hx of left big toe amputation 8 years ago. He denies other symptoms.  He was recommended amputation of the third toe due to osteomyelitis.  He refused.  Patient left the hospital in [place] to travel back to [place], but stopped in [place] secondary to worsening pain, redness and swelling.  Past Medical History  Diagnosis Date  . Diabetes mellitus without complication   . Asthma    Past Surgical History  Procedure Laterality Date  . Toe amputation     No family history on file. History  Substance Use Topics  . Smoking status: Current Every Day Smoker -- 0.00 packs/day    Types: Cigarettes  . Smokeless tobacco: Not on file  . Alcohol Use: No    Review of Systems  Constitutional: Positive for fever.  Musculoskeletal: Positive for joint swelling and arthralgias.  Skin: Positive for wound.  All other systems reviewed and are negative.  Allergies  Review of patient's allergies  indicates no known allergies.  Home Medications   Prior to Admission medications   Medication Sig Start Date End Date Taking? Authorizing Provider  ciprofloxacin (CIPRO) 750 MG tablet Take 1 tablet (750 mg total) by mouth 2 (two) times daily. 09/27/14   Charm Rings, MD  cyclobenzaprine (FLEXERIL) 10 MG tablet Take 1 tablet (10 mg total) by mouth 2 (two) times daily as needed for muscle spasms. 10/15/14   Teressa Lower, NP  ibuprofen (ADVIL,MOTRIN) 800 MG tablet Take 1 tablet (800 mg total) by mouth 3 (three) times daily. 10/15/14   Teressa Lower, NP  lisinopril (PRINIVIL,ZESTRIL) 10 MG tablet Take 10 mg by mouth daily.    Historical Provider, MD  metFORMIN (GLUCOPHAGE) 1000 MG tablet Take 1,000 mg by mouth 2 (two) times daily with a meal.    Historical Provider, MD  traMADol (ULTRAM) 50 MG tablet Take 1 tablet (50 mg total) by mouth every 6 (six) hours as needed. 09/27/14   Charm Rings, MD   BP 149/109 mmHg  Pulse 94  Temp(Src) 98.2 F (36.8 C) (Oral)  Resp 20  SpO2 95% Physical Exam  Constitutional: He is oriented to person, place, and time. He appears well-developed and well-nourished.  HENT:  Head: Normocephalic and atraumatic.  Eyes: Conjunctivae and EOM are normal. Pupils are equal, round, and reactive to light.  Neck: Normal range of motion. Neck supple.  Cardiovascular: Normal rate.   Pulmonary/Chest: Effort normal. No respiratory distress.  Abdominal: He exhibits no distension.  Musculoskeletal: Normal range of motion.  Patient has sloughing of the skin to the third toe with unroofed blister to the dorsal aspect of the foot.  He has erythema extending to the mid foot.  There are ulcerations and early gangrene to the bottom side of the third toe.  Second and fourth toe are also erythematous and inflamed.  There is no crepitus.  Please see pictures for better illustration.  Neurological: He is alert and oriented to person, place, and time.  Skin: Skin is warm and dry.   Psychiatric: He has a normal mood and affect. His behavior is normal.  Nursing note and vitals reviewed.   ED Course  Procedures (including critical care time) DIAGNOSTIC STUDIES: Oxygen Saturation is 95% on RA, adequate by my interpretation.    COORDINATION OF CARE: 2:25 AM Discussed treatment plan with pt at bedside and pt agreed to plan.          Labs Review Labs Reviewed  SEDIMENTATION RATE - Abnormal; Notable for the following:    Sed Rate 49 (*)    All other components within normal limits  C-REACTIVE PROTEIN - Abnormal; Notable for the following:    CRP 7.1 (*)    All other components within normal limits  CBC WITH DIFFERENTIAL/PLATELET - Abnormal; Notable for the following:    Hemoglobin 12.4 (*)    HCT 37.1 (*)    MCV 69.5 (*)    MCH 23.2 (*)    Basophils Relative 2 (*)    All other components within normal limits  BASIC METABOLIC PANEL - Abnormal; Notable for the following:    Sodium 133 (*)    Potassium 5.2 (*)    Chloride 100 (*)    Glucose, Bld 456 (*)    BUN 21 (*)    All other components within normal limits  CULTURE, BLOOD (ROUTINE X 2)  CULTURE, BLOOD (ROUTINE X 2)  HIV ANTIBODY (ROUTINE TESTING)  PREALBUMIN  CREATININE, SERUM  HEMOGLOBIN A1C  HEPATIC FUNCTION PANEL    Imaging Review Dg Foot Complete Left  12/03/2014   CLINICAL DATA:  Acute onset of left foot pain and erythema. Initial encounter.  EXAM: LEFT FOOT - COMPLETE 3+ VIEW  COMPARISON:  None.  FINDINGS: The patient is status post amputation of much of the first metatarsal. Degenerative change is noted at the second and third metatarsophalangeal joints. There is chronic resorption involving the distal aspects of the second and third toes. Would correlate clinically for any evidence of underlying osteomyelitis. Associated soft tissue swelling is noted about the second and third toes.  The visualized joint spaces are grossly preserved. An os peroneum is noted. Plantar and posterior calcaneal  spurs are incidentally seen.  IMPRESSION: 1. Chronic resorption involving the distal aspects of the second and third toes. Would correlate clinically for any evidence of underlying osteomyelitis. 2. Status post amputation of much of the first metatarsal. Degenerative change at the second and third metatarsophalangeal joints. 3. Os peroneum noted.   Electronically Signed   By: Roanna Raider M.D.   On: 12/03/2014 02:55     EKG Interpretation None      MDM   Final diagnoses:  Diabetic foot ulcer with osteomyelitis    I personally performed the services described in this documentation, which was scribed in my presence. The recorded information has been reviewed and is accurate.  54 year old male recently treated  outside hospital in [place] for cellulitis/osteomyelitis of his left third toe.  Patient left the hospital to travel back to [place], but reports worsening redness and pain to the foot.  Concerning for osteomyelitis.  Patient to be restarted on Zosyn and vancomycin  Case was discussed with Dr. Allena Katz with hospitalist service who will admit.  Patient updated on findings and plan.  Marisa Severin, MD 12/03/14 [phone]

## 2014-12-03 NOTE — H&P (Signed)
[facility] History and Physical  Patient: Marc Miranda  MRN: [id number]  DOB: 05/21/1960  DOS: the patient was seen and examined on 12/03/2014 PCP: No PCP Per Patient  Referring physician: Dr. Norlene Campbell Chief Complaint: Nonhealing ulcer  HPI: Marc Miranda is a 54 y.o. male with Past medical history of diabetes mellitus, hypertension, anemia, neuropathy, amputation of the left great toe. The patient is presenting with complaints of nonhealing ulcer of the left second and third toe. The patient is basically from [place] and has all his care there. Patient has gone to [facility], for a vacation where after a shower he suddenly noted having severe pain of his left leg going from his groin to his legs. The pain was not improving with his routine medication. The next day he has noted that there was significant swelling of the toe and he was having some bleeding. He then to [facility], where he was admitted for 3 days and was given IV antibiotics. He was also recommended for amputation. But he decided to go to [place] where all his care is and to get a second opinion there. While he is driving back from [place] to [place] he started having progressively worsening pain of the left leg and when he tried to open the wound he found that the infection was spreading more. With this he decided to come to [facility] and currently he is willing to go through any procedures required to heal this infection. He denies any fever or chills chest when abdominal pain shortness of breath cough diarrhea constipation burning urination. Denies any alcohol or drug abuse.  The patient is coming from home.  At his baseline ambulates without any support And is independent for most of his ADL manages her medication on his own.  Review of Systems: as mentioned in the history of present illness.  A comprehensive review of the other systems is negative.  Past Medical History  Diagnosis Date  .  Diabetes mellitus without complication   . Asthma    Past Surgical History  Procedure Laterality Date  . Toe amputation     Social History:  reports that he has been smoking Cigarettes.  He has been smoking about 0.00 packs per day. He does not have any smokeless tobacco history on file. He reports that he does not drink alcohol or use illicit drugs.  No Known Allergies  No family history on file.  Prior to Admission medications   Medication Sig Start Date End Date Taking? Authorizing Provider  ciprofloxacin (CIPRO) 500 MG tablet Take 500 mg by mouth 2 (two) times daily. 12/01/14  Yes Historical Provider, MD  clindamycin (CLEOCIN) 300 MG capsule Take 300 mg by mouth 4 (four) times daily. 12/01/14  Yes Historical Provider, MD  glimepiride (AMARYL) 2 MG tablet Take 2 mg by mouth daily. 11/23/14  Yes Historical Provider, MD  HYDROcodone-acetaminophen (NORCO) 10-325 MG per tablet Take 1 tablet by mouth every 6 (six) hours as needed. 12/01/14  Yes Historical Provider, MD  insulin glargine (LANTUS) 100 UNIT/ML injection Inject 60 Units into the skin at bedtime.   Yes Historical Provider, MD  lisinopril (PRINIVIL,ZESTRIL) 10 MG tablet Take 10 mg by mouth daily.   Yes Historical Provider, MD  metFORMIN (GLUCOPHAGE) 1000 MG tablet Take 1,000 mg by mouth 2 (two) times daily with a meal.   Yes Historical Provider, MD  omeprazole (PRILOSEC) 40 MG capsule Take 40 mg by mouth daily. 09/26/13  Yes Historical Provider,  MD  saxagliptin HCl (ONGLYZA) 5 MG TABS tablet Take 5 mg by mouth daily.   Yes Historical Provider, MD  ciprofloxacin (CIPRO) 750 MG tablet Take 1 tablet (750 mg total) by mouth 2 (two) times daily. Patient not taking: Reported on 12/03/2014 09/27/14   Charm Rings, MD  cyclobenzaprine (FLEXERIL) 10 MG tablet Take 1 tablet (10 mg total) by mouth 2 (two) times daily as needed for muscle spasms. Patient not taking: Reported on 12/03/2014 10/15/14   Teressa Lower, NP  ibuprofen (ADVIL,MOTRIN) 800 MG  tablet Take 1 tablet (800 mg total) by mouth 3 (three) times daily. Patient not taking: Reported on 12/03/2014 10/15/14   Teressa Lower, NP  traMADol (ULTRAM) 50 MG tablet Take 1 tablet (50 mg total) by mouth every 6 (six) hours as needed. Patient not taking: Reported on 12/03/2014 09/27/14   Charm Rings, MD    Physical Exam: Filed Vitals:   12/03/14 0300 12/03/14 0330 12/03/14 0335 12/03/14 0437  BP: 123/75 105/45 121/78 117/73  Pulse: 88 83 77 77  Temp:   98.5 F (36.9 C)   TempSrc:   Oral   Resp:   18 18  SpO2: 99% 97% 96% 98%    General: Alert, Awake and Oriented to Time, Place and Person. Appear in mild distress Eyes: PERRL ENT: Oral Mucosa clear moist. Neck: no JVD Cardiovascular: S1 and S2 Present, no Murmur, Peripheral Pulses Present Respiratory: Bilateral Air entry equal and Decreased,  Clear to Auscultation, no Crackles, no wheezes Abdomen: Bowel Sound ,present Soft and non tender Skin: no Rash Extremities: Left more than right Pedal edema, no calf tenderness Neurologic: Grossly no focal neuro deficit.  Labs on Admission:  CBC:  Recent Labs Lab 12/03/14 0152  WBC 7.1  NEUTROABS 3.5  HGB 12.4*  HCT 37.1*  MCV 69.5*  PLT 170    CMP     Component Value Date/Time   NA 133* 12/03/2014 0152   K 5.2* 12/03/2014 0152   CL 100* 12/03/2014 0152   CO2 25 12/03/2014 0152   GLUCOSE 456* 12/03/2014 0152   BUN 21* 12/03/2014 0152   CREATININE 1.15 12/03/2014 0152   CALCIUM 9.1 12/03/2014 0152   GFRNONAA >60 12/03/2014 0152   GFRAA >60 12/03/2014 0152    No results for input(s): LIPASE, AMYLASE in the last 168 hours.  No results for input(s): CKTOTAL, CKMB, CKMBINDEX, TROPONINI in the last 168 hours. BNP (last 3 results) No results for input(s): BNP in the last 8760 hours.  ProBNP (last 3 results) No results for input(s): PROBNP in the last 8760 hours.   Radiological Exams on Admission: Dg Foot Complete Left  12/03/2014   CLINICAL DATA:  Acute onset of  left foot pain and erythema. Initial encounter.  EXAM: LEFT FOOT - COMPLETE 3+ VIEW  COMPARISON:  None.  FINDINGS: The patient is status post amputation of much of the first metatarsal. Degenerative change is noted at the second and third metatarsophalangeal joints. There is chronic resorption involving the distal aspects of the second and third toes. Would correlate clinically for any evidence of underlying osteomyelitis. Associated soft tissue swelling is noted about the second and third toes.  The visualized joint spaces are grossly preserved. An os peroneum is noted. Plantar and posterior calcaneal spurs are incidentally seen.  IMPRESSION: 1. Chronic resorption involving the distal aspects of the second and third toes. Would correlate clinically for any evidence of underlying osteomyelitis. 2. Status post amputation of much of the first metatarsal. Degenerative  change at the second and third metatarsophalangeal joints. 3. Os peroneum noted.   Electronically Signed   By: Roanna Raider M.D.   On: 12/03/2014 02:55   Assessment/Plan Principal Problem:   Diabetic foot ulcer with osteomyelitis Active Problems:   Cellulitis   Type 2 diabetes mellitus   Essential hypertension   History of amputation   1. Diabetic foot ulcer with osteomyelitis  The patient is presenting with complaints of nonhealing ulcer of the left foot. He was diagnosed with a posterior mellitus and the other facility although he denies going through any bone scan or MRI. He mentions he might have ABI. With this I would currently treat him aggressively with broad-spectrum antibiotics vancomycin and Fortaz. We'll check routine blood work. Orthopedic will be consulted in the morning for further formation. Check vascular Doppler.  2. Diabetes mellitus. Probably not controlled. We will try to use home insulin as well as sliding scale insulin.  3. History of hypertension. Continue lisinopril.  Advance goals of care discussion:  Full code   DVT Prophylaxis: subcutaneous Heparin Nutrition: Nothing by mouth except medications  Family Communication: family was present at bedside, opportunity was given to ask question and all questions were answered satisfactorily at the time of interview. Disposition: Admitted as inpatient, med surge unit.  Author: Lynden Oxford, MD [facility] Pager: [phone] 12/03/2014  If 7PM-7AM, please contact night-coverage [email or web address] Password [email or web address]

## 2014-12-03 NOTE — ED Notes (Signed)
Pt sts he began to have leg pain last week (11/26/14) and went to [place].  Pt sts he woke up Friday with no skin issues, but when he got home and went to take his socks off he noticed blood had pooled in his shoe.  Pt sts there was a "blister" on his toe that had burst.  Pt sts he was seen at an ED in [place], where they wanted to admit him, but pt preferred to be home in his home state.  Pt has a hx of diabetes, and "some" neuropathy.

## 2014-12-03 NOTE — Progress Notes (Signed)
Admission note:  Arrival Method: Patient arrived to this unit with wife accompanying him. Mental Orientation:  A &O x 4. Telemetry: N/A Assessment: See doc flow sheets. Skin: Warm, dry and intact except he has infected left second toe.  He also has left great toe amputated. IV: Right forearm, intact, patent and saline locked. Pain: 7/10, administered pain medicine. ( Oxy and Dilaudid). Tubes: N/A Safety Measures: Bed in low position, call light and phone within reach. Fall Prevention Safety Plan: Reviewed the plan, understood and acknowledged Admission Screening: In progress. 6700 Orientation: Patient has been oriented to the unit, staff and to the room.

## 2014-12-04 ENCOUNTER — Encounter (HOSPITAL_COMMUNITY)
Admission: EM | Disposition: A | Discharge status: Home / Self Care | Payer: Self-pay | Source: Home / Self Care | Attending: Family Medicine

## 2014-12-04 ENCOUNTER — Inpatient Hospital Stay (HOSPITAL_COMMUNITY): Payer: Medicaid - Out of State | Admitting: Anesthesiology

## 2014-12-04 ENCOUNTER — Ambulatory Visit (HOSPITAL_COMMUNITY): Payer: Medicaid - Out of State

## 2014-12-04 ENCOUNTER — Encounter (HOSPITAL_COMMUNITY): Payer: Self-pay | Admitting: Anesthesiology

## 2014-12-04 DIAGNOSIS — E11621 Type 2 diabetes mellitus with foot ulcer: Principal | ICD-10-CM

## 2014-12-04 HISTORY — PX: AMPUTATION: SHX166

## 2014-12-04 LAB — GLUCOSE, CAPILLARY
GLUCOSE-CAPILLARY: 251 mg/dL — AB (ref 65–99)
GLUCOSE-CAPILLARY: 232 mg/dL — AB (ref 65–99)
Glucose-Capillary: 210 mg/dL — ABNORMAL HIGH (ref 65–99)
Glucose-Capillary: 288 mg/dL — ABNORMAL HIGH (ref 65–99)

## 2014-12-04 LAB — BASIC METABOLIC PANEL
Anion gap: 6 (ref 5–15)
BUN: 15 mg/dL (ref 6–20)
CO2: 28 mmol/L (ref 22–32)
Calcium: 9 mg/dL (ref 8.9–10.3)
Chloride: 103 mmol/L (ref 101–111)
Creatinine, Ser: 0.91 mg/dL (ref 0.61–1.24)
GFR calc Af Amer: >60 mL/min (ref >60)
GFR calc non Af Amer: >60 mL/min (ref >60)
Glucose, Bld: 307 mg/dL — ABNORMAL HIGH (ref 65–99)
POTASSIUM: 4.3 mmol/L (ref 3.5–5.1)
SODIUM: 137 mmol/L (ref 135–145)

## 2014-12-04 LAB — CBC
HCT: 36.8 % — ABNORMAL LOW (ref 39.0–52.0)
Hemoglobin: 11.8 g/dL — ABNORMAL LOW (ref 13.0–17.0)
MCH: 22.7 pg — AB (ref 26.0–34.0)
MCHC: 32.1 g/dL (ref 30.0–36.0)
MCV: 70.8 fL — AB (ref 78.0–100.0)
Platelets: 182 10*3/uL (ref 150–400)
RBC: 5.2 MIL/uL (ref 4.22–5.81)
RDW: 14.8 % (ref 11.5–15.5)
WBC: 6.4 10*3/uL (ref 4.0–10.5)

## 2014-12-04 LAB — SURGICAL PCR SCREEN
MRSA, PCR: NEGATIVE
Staphylococcus aureus: NEGATIVE

## 2014-12-04 LAB — HEMOGLOBIN A1C
Hgb A1c MFr Bld: 11.4 % — ABNORMAL HIGH (ref 4.8–5.6)
Mean Plasma Glucose: 280 mg/dL

## 2014-12-04 SURGERY — AMPUTATION, FOOT, RAY
Anesthesia: General | Site: Foot | Laterality: Left

## 2014-12-04 MED ORDER — FENTANYL CITRATE (PF) 250 MCG/5ML IJ SOLN
INTRAMUSCULAR | Status: AC
  Filled 2014-12-04: qty 5

## 2014-12-04 MED ORDER — SENNA 8.6 MG PO TABS
1.0000 | ORAL_TABLET | Freq: Every day | ORAL | Status: DC | PRN
  Administered 2014-12-04: 8.6 mg via ORAL
  Filled 2014-12-04: qty 1

## 2014-12-04 MED ORDER — HYDROMORPHONE HCL 1 MG/ML IJ SOLN
0.2500 mg | INTRAMUSCULAR | Status: DC | PRN
  Administered 2014-12-04 (×2): 0.25 mg via INTRAVENOUS
  Administered 2014-12-04: 0.5 mg via INTRAVENOUS

## 2014-12-04 MED ORDER — HYDROMORPHONE HCL 1 MG/ML IJ SOLN
INTRAMUSCULAR | Status: AC
  Filled 2014-12-04: qty 1

## 2014-12-04 MED ORDER — ONDANSETRON HCL 4 MG/2ML IJ SOLN: 4.0000 mg | Freq: Four times a day (QID) | INTRAMUSCULAR | Status: DC | PRN

## 2014-12-04 MED ORDER — FENTANYL CITRATE (PF) 100 MCG/2ML IJ SOLN
INTRAMUSCULAR | Status: DC | PRN
  Administered 2014-12-04: 50 ug via INTRAVENOUS

## 2014-12-04 MED ORDER — PROPOFOL 10 MG/ML IV BOLUS
INTRAVENOUS | Status: DC | PRN
  Administered 2014-12-04: 260 mg via INTRAVENOUS

## 2014-12-04 MED ORDER — METHOCARBAMOL 1000 MG/10ML IJ SOLN
500.0000 mg | Freq: Four times a day (QID) | INTRAVENOUS | Status: DC | PRN
  Filled 2014-12-04: qty 5

## 2014-12-04 MED ORDER — OXYCODONE HCL 5 MG PO TABS
ORAL_TABLET | ORAL | Status: AC
  Filled 2014-12-04: qty 1

## 2014-12-04 MED ORDER — METOCLOPRAMIDE HCL 5 MG PO TABS: 5.0000 mg | ORAL_TABLET | Freq: Three times a day (TID) | ORAL | Status: DC | PRN

## 2014-12-04 MED ORDER — OXYCODONE HCL 5 MG/5ML PO SOLN: 5.0000 mg | Freq: Once | ORAL | Status: AC | PRN

## 2014-12-04 MED ORDER — LIDOCAINE HCL (CARDIAC) 20 MG/ML IV SOLN
INTRAVENOUS | Status: AC
  Filled 2014-12-04: qty 5

## 2014-12-04 MED ORDER — PROPOFOL 10 MG/ML IV BOLUS
INTRAVENOUS | Status: AC
  Filled 2014-12-04: qty 20

## 2014-12-04 MED ORDER — ONDANSETRON HCL 4 MG/2ML IJ SOLN
INTRAMUSCULAR | Status: AC
  Filled 2014-12-04: qty 2

## 2014-12-04 MED ORDER — MIDAZOLAM HCL 2 MG/2ML IJ SOLN
INTRAMUSCULAR | Status: AC
  Filled 2014-12-04: qty 4

## 2014-12-04 MED ORDER — ACETAMINOPHEN 650 MG RE SUPP: 650.0000 mg | Freq: Four times a day (QID) | RECTAL | Status: DC | PRN

## 2014-12-04 MED ORDER — ONDANSETRON HCL 4 MG PO TABS: 4.0000 mg | ORAL_TABLET | Freq: Four times a day (QID) | ORAL | Status: DC | PRN

## 2014-12-04 MED ORDER — ACETAMINOPHEN 325 MG PO TABS: 650.0000 mg | ORAL_TABLET | Freq: Four times a day (QID) | ORAL | Status: DC | PRN

## 2014-12-04 MED ORDER — OXYCODONE HCL 5 MG PO TABS
5.0000 mg | ORAL_TABLET | Freq: Once | ORAL | Status: AC | PRN
  Administered 2014-12-04: 5 mg via ORAL

## 2014-12-04 MED ORDER — PIPERACILLIN-TAZOBACTAM 3.375 G IVPB 30 MIN
3.3750 g | INTRAVENOUS | Status: AC
  Administered 2014-12-04: 3.375 g via INTRAVENOUS
  Filled 2014-12-04: qty 50

## 2014-12-04 MED ORDER — MIDAZOLAM HCL 5 MG/5ML IJ SOLN
INTRAMUSCULAR | Status: DC | PRN
  Administered 2014-12-04: 2 mg via INTRAVENOUS

## 2014-12-04 MED ORDER — 0.9 % SODIUM CHLORIDE (POUR BTL) OPTIME
TOPICAL | Status: DC | PRN
  Administered 2014-12-04: 1000 mL

## 2014-12-04 MED ORDER — LIDOCAINE HCL (CARDIAC) 20 MG/ML IV SOLN
INTRAVENOUS | Status: DC | PRN
  Administered 2014-12-04: 70 mg via INTRAVENOUS

## 2014-12-04 MED ORDER — SODIUM CHLORIDE 0.9 % IV SOLN: INTRAVENOUS | Status: DC

## 2014-12-04 MED ORDER — LACTATED RINGERS IV SOLN
INTRAVENOUS | Status: DC | PRN
  Administered 2014-12-04: 11:00:00 via INTRAVENOUS

## 2014-12-04 MED ORDER — METOCLOPRAMIDE HCL 5 MG/ML IJ SOLN: 5.0000 mg | Freq: Three times a day (TID) | INTRAMUSCULAR | Status: DC | PRN

## 2014-12-04 MED ORDER — METHOCARBAMOL 500 MG PO TABS: 500.0000 mg | ORAL_TABLET | Freq: Four times a day (QID) | ORAL | Status: DC | PRN

## 2014-12-04 SURGICAL SUPPLY — 30 items
BLADE SAW SGTL MED 73X18.5 STR (BLADE) IMPLANT
BNDG COHESIVE 4X5 TAN STRL (GAUZE/BANDAGES/DRESSINGS) ×2 IMPLANT
BNDG GAUZE ELAST 4 BULKY (GAUZE/BANDAGES/DRESSINGS) ×2 IMPLANT
COVER SURGICAL LIGHT HANDLE (MISCELLANEOUS) ×2 IMPLANT
DRAPE U-SHAPE 47X51 STRL (DRAPES) ×2 IMPLANT
DRSG ADAPTIC 3X8 NADH LF (GAUZE/BANDAGES/DRESSINGS) ×2 IMPLANT
DRSG PAD ABDOMINAL 8X10 ST (GAUZE/BANDAGES/DRESSINGS) ×2 IMPLANT
DURAPREP 26ML APPLICATOR (WOUND CARE) ×2 IMPLANT
ELECT REM PT RETURN 9FT ADLT (ELECTROSURGICAL) ×2
ELECTRODE REM PT RTRN 9FT ADLT (ELECTROSURGICAL) ×1 IMPLANT
GAUZE SPONGE 4X4 12PLY STRL (GAUZE/BANDAGES/DRESSINGS) ×2 IMPLANT
GLOVE BIOGEL PI IND STRL 9 (GLOVE) ×1 IMPLANT
GLOVE BIOGEL PI INDICATOR 9 (GLOVE) ×1
GLOVE SURG ORTHO 9.0 STRL STRW (GLOVE) ×2 IMPLANT
GOWN STRL REUS W/ TWL LRG LVL3 (GOWN DISPOSABLE) ×1 IMPLANT
GOWN STRL REUS W/ TWL XL LVL3 (GOWN DISPOSABLE) ×2 IMPLANT
GOWN STRL REUS W/TWL LRG LVL3 (GOWN DISPOSABLE) ×1
GOWN STRL REUS W/TWL XL LVL3 (GOWN DISPOSABLE) ×2
KIT BASIN OR (CUSTOM PROCEDURE TRAY) ×2 IMPLANT
KIT ROOM TURNOVER OR (KITS) ×2 IMPLANT
NS IRRIG 1000ML POUR BTL (IV SOLUTION) ×2 IMPLANT
PACK ORTHO EXTREMITY (CUSTOM PROCEDURE TRAY) ×2 IMPLANT
PAD ARMBOARD 7.5X6 YLW CONV (MISCELLANEOUS) ×4 IMPLANT
SPONGE LAP 18X18 X RAY DECT (DISPOSABLE) ×2 IMPLANT
STOCKINETTE IMPERVIOUS LG (DRAPES) ×2 IMPLANT
SUT ETHILON 2 0 PSLX (SUTURE) ×4 IMPLANT
TOWEL OR 17X24 6PK STRL BLUE (TOWEL DISPOSABLE) ×2 IMPLANT
TOWEL OR 17X26 10 PK STRL BLUE (TOWEL DISPOSABLE) ×2 IMPLANT
UNDERPAD 30X30 INCONTINENT (UNDERPADS AND DIAPERS) IMPLANT
WATER STERILE IRR 1000ML POUR (IV SOLUTION) IMPLANT

## 2014-12-04 NOTE — Progress Notes (Signed)
[facility] PROGRESS NOTE  Marc Miranda ZOX:[id number] DOB: [birth year] DOA: 12/03/2014 PCP: No PCP Per Patient  Assessment/Plan: Principal Problem:   Diabetic foot ulcer with osteomyelitis - Orthopedic surgeon managing - Defer whether or not patient will require antibiotics to surgeon   Active Problems:    Type 2 diabetes mellitus - Continue carb modified diet - Continue Lantus and sliding scale insulin    Essential hypertension -Relatively well controlled off antihypertensives  Code Status: full Family Communication: Discussed with patient and spouse at bedside Disposition Plan: Pending final recommendations from orthopedic surgeon   Consultants:  Orthopedic surgeon  Procedures:  None  Antibiotics:  Carb modified diet   HPI/Subjective: Pt has no new complaints. No acute issues overnight.  Objective: Filed Vitals:   12/04/14 1319  BP: 123/78  Pulse: 68  Temp: 97.8 F (36.6 C)  Resp: 16    Intake/Output Summary (Last 24 hours) at 12/04/14 1648 Last data filed at 12/04/14 1300  Gross per 24 hour  Intake    200 ml  Output     10 ml  Net    190 ml   Filed Weights   12/03/14 0607 12/03/14 2132  Weight: 133.811 kg (295 lb) 134.718 kg (297 lb)    Exam:   General:  Pt in nad, alert and awake  Cardiovascular: rrr, no mrg  Respiratory: cta bl, no wheezes  Abdomen: soft, NT, ND  Musculoskeletal: left foot wrapped no active bleeding.    Data Reviewed: Basic Metabolic Panel:  Recent Labs Lab 12/03/14 0152 12/03/14 0715 12/03/14 2052 12/04/14 0601  NA 133*  --  136 137  K 5.2*  --  4.8 4.3  CL 100*  --  102 103  CO2 25  --  28 28  GLUCOSE 456*  --  387* 307*  BUN 21*  --  16 15  CREATININE 1.15 1.00 1.09 0.91  CALCIUM 9.1  --  9.0 9.0   Liver Function Tests:  Recent Labs Lab 12/03/14 0715 12/03/14 2052  AST 27 24  ALT 47 44  ALKPHOS 95 75  BILITOT 0.4 0.3  PROT 7.3 6.5  ALBUMIN 3.1* 2.9*   No results for input(s):  LIPASE, AMYLASE in the last 168 hours. No results for input(s): AMMONIA in the last 168 hours. CBC:  Recent Labs Lab 12/03/14 0152 12/03/14 2052 12/04/14 0601  WBC 7.1 6.1 6.4  NEUTROABS 3.5 2.7  --   HGB 12.4* 11.3* 11.8*  HCT 37.1* 35.4* 36.8*  MCV 69.5* 70.0* 70.8*  PLT 170 168 182   Cardiac Enzymes:  Recent Labs Lab 12/03/14 0723  CKTOTAL 221   BNP (last 3 results) No results for input(s): BNP in the last 8760 hours.  ProBNP (last 3 results) No results for input(s): PROBNP in the last 8760 hours.  CBG:  Recent Labs Lab 12/03/14 1227 12/03/14 1734 12/03/14 2130 12/04/14 0759 12/04/14 1147  GLUCAP 311* 249* 408* 251* 210*    Recent Results (from the past 240 hour(s))  Blood Cultures x 2 sites     Status: None (Preliminary result)   Collection Time: 12/03/14  7:15 AM  Result Value Ref Range Status   Specimen Description BLOOD RIGHT ARM  Final   Special Requests BOTTLES DRAWN AEROBIC AND ANAEROBIC 10CC  Final   Culture NO GROWTH 1 DAY  Final   Report Status PENDING  Incomplete  Blood Cultures x 2 sites     Status: None (Preliminary result)   Collection Time: 12/03/14  7:23 AM  Result Value Ref Range Status   Specimen Description BLOOD RIGHT ARM  Final   Special Requests BOTTLES DRAWN AEROBIC AND ANAEROBIC 10CC  Final   Culture NO GROWTH 1 DAY  Final   Report Status PENDING  Incomplete  Surgical pcr screen     Status: None   Collection Time: 12/10/2014  9:36 PM  Result Value Ref Range Status   MRSA, PCR NEGATIVE NEGATIVE Final   Staphylococcus aureus NEGATIVE NEGATIVE Final    Comment:        The Xpert SA Assay (FDA approved for NASAL specimens in patients over 93 years of age), is one component of a comprehensive surveillance program.  Test performance has been validated by [facility] for patients greater than or equal to 22 year old. It is not intended to diagnose infection nor to guide or monitor treatment.      Studies: Dg Foot Complete  Left  12-10-2014   CLINICAL DATA:  Acute onset of left foot pain and erythema. Initial encounter.  EXAM: LEFT FOOT - COMPLETE 3+ VIEW  COMPARISON:  None.  FINDINGS: The patient is status post amputation of much of the first metatarsal. Degenerative change is noted at the second and third metatarsophalangeal joints. There is chronic resorption involving the distal aspects of the second and third toes. Would correlate clinically for any evidence of underlying osteomyelitis. Associated soft tissue swelling is noted about the second and third toes.  The visualized joint spaces are grossly preserved. An os peroneum is noted. Plantar and posterior calcaneal spurs are incidentally seen.  IMPRESSION: 1. Chronic resorption involving the distal aspects of the second and third toes. Would correlate clinically for any evidence of underlying osteomyelitis. 2. Status post amputation of much of the first metatarsal. Degenerative change at the second and third metatarsophalangeal joints. 3. Os peroneum noted.   Electronically Signed   By: Roanna Raider M.D.   On: 12/10/2014 02:55    Scheduled Meds: . enoxaparin (LOVENOX) injection  40 mg Subcutaneous Q24H  . feeding supplement (GLUCERNA SHAKE)  237 mL Oral TID BM  . feeding supplement (PRO-STAT SUGAR FREE 64)  30 mL Oral Daily  . HYDROmorphone      . insulin aspart  0-15 Units Subcutaneous TID WC  . insulin aspart  0-5 Units Subcutaneous QHS  . insulin glargine  60 Units Subcutaneous QHS  . oxyCODONE      . pantoprazole  40 mg Oral Daily   Continuous Infusions: . sodium chloride       Time spent: > 35 minutes    Penny Pia  [facility] Pager [phone]. If 7PM-7AM, please contact night-coverage at [email or web address], password [email or web address] 12/04/2014, 4:48 PM  LOS: 1 day

## 2014-12-04 NOTE — Op Note (Signed)
12/03/2014 - 12/04/2014  11:43 AM  PATIENT:  Marc Miranda    PRE-OPERATIVE DIAGNOSIS: Gangrenous ulcer and Osteomyelitis third Toe Left Foot  POST-OPERATIVE DIAGNOSIS:  Same  PROCEDURE:  tRANSMETATARSAL LEFT FOOT  SURGEON:  Nadara Mustard, MD  PHYSICIAN ASSISTANT:None ANESTHESIA:   General  PREOPERATIVE INDICATIONS:  Marc Miranda is a  54 y.o. male with a diagnosis of Osteomyelitis 2nd Toe Left Foot who failed conservative measures and elected for surgical management.    The risks benefits and alternatives were discussed with the patient preoperatively including but not limited to the risks of infection, bleeding, nerve injury, cardiopulmonary complications, the need for revision surgery, among others, and the patient was willing to proceed.  OPERATIVE IMPLANTS: None  OPERATIVE FINDINGS: No signs of infection at the amputation site  OPERATIVE PROCEDURE: Patient is a 54 year old gentleman with diabetic insensate neuropathy he has previously undergone a first ray amputation left foot. Patient presents at this time with fixed clawing of the lesser toes he has a necrotic third toe with osteomyelitis. Discussed with the patient with the necrotic ulceration and the previous first ray amputation his best option for a good weightbearing foot would be a transmetatarsal amputation. Patient states he understands and wishes to proceed at this time.  Patient was brought to the operating room and underwent a general asthenic. After adequate levels anesthesia were obtained patient's left lower extremity was prepped using DuraPrep draped into a sterile field. A timeout was called. A fishmouth incision was made through the midfoot. An oscillating saw was used to create a gentle cascade with a transmetatarsal amputation. Further irrigation was performed. Electrocautery was used for hemostasis. 2-0 nylon was used for wound closure there was no tension on the skin. A sterile compressive dressing was applied.  Patient was extubated taken to the PACU in stable condition.

## 2014-12-04 NOTE — Progress Notes (Signed)
Patient blood sugar 408 tonight Maren Reamer NP notified.No new orders at present time.Patient to receive 60 units lantus and 5 units novolog sq.Will continue to monitor.

## 2014-12-04 NOTE — Interval H&P Note (Signed)
History and Physical Interval Note:  12/04/2014 6:30 AM  Marc Miranda  has presented today for surgery, with the diagnosis of Osteomyelitis 2nd Toe Left Foot  The various methods of treatment have been discussed with the patient and family. After consideration of risks, benefits and other options for treatment, the patient has consented to  Procedure(s): tRANSMETATARSAL LEFT FOOT (Left) as a surgical intervention .  The patient's history has been reviewed, patient examined, no change in status, stable for surgery.  I have reviewed the patient's chart and labs.  Questions were answered to the patient's satisfaction.     DUDA,MARCUS V

## 2014-12-04 NOTE — H&P (View-Only) (Signed)
Reason for Consult: Osteomyelitis ulceration and cellulitis left forefoot Referring Physician: Dr Jari Favre is an 54 y.o. male.  HPI: Patient is a 54 year old gentleman diabetic insensate neuropathy who presents with ulceration necrosis cellulitis left foot third toe   Past Medical History  Diagnosis Date  . Diabetes mellitus without complication   . Asthma     Past Surgical History  Procedure Laterality Date  . Toe amputation      History reviewed. No pertinent family history.  Social History:  reports that he has been smoking Cigarettes.  He has a 10 pack-year smoking history. He has never used smokeless tobacco. He reports that he does not drink alcohol or use illicit drugs.  Allergies: No Known Allergies  Medications: I have reviewed the patient's current medications.  Results for orders placed or performed during the hospital encounter of 12/03/14 (from the past 48 hour(s))  Sedimentation rate     Status: Abnormal   Collection Time: 12/03/14  1:52 AM  Result Value Ref Range   Sed Rate 49 (H) 0 - 16 mm/hr  C-reactive protein     Status: Abnormal   Collection Time: 12/03/14  1:52 AM  Result Value Ref Range   CRP 7.1 (H) <1.0 mg/dL  CBC with Differential/Platelet     Status: Abnormal   Collection Time: 12/03/14  1:52 AM  Result Value Ref Range   WBC 7.1 4.0 - 10.5 K/uL   RBC 5.34 4.22 - 5.81 MIL/uL   Hemoglobin 12.4 (L) 13.0 - 17.0 g/dL   HCT 37.1 (L) 39.0 - 52.0 %   MCV 69.5 (L) 78.0 - 100.0 fL   MCH 23.2 (L) 26.0 - 34.0 pg   MCHC 33.4 30.0 - 36.0 g/dL   RDW 14.7 11.5 - 15.5 %   Platelets 170 150 - 400 K/uL   Neutrophils Relative % 48 43 - 77 %   Lymphocytes Relative 38 12 - 46 %   Monocytes Relative 9 3 - 12 %   Eosinophils Relative 3 0 - 5 %   Basophils Relative 2 (H) 0 - 1 %   Neutro Abs 3.5 1.7 - 7.7 K/uL   Lymphs Abs 2.7 0.7 - 4.0 K/uL   Monocytes Absolute 0.6 0.1 - 1.0 K/uL   Eosinophils Absolute 0.2 0.0 - 0.7 K/uL   Basophils Absolute 0.1  0.0 - 0.1 K/uL   RBC Morphology TEARDROP CELLS     Comment: ELLIPTOCYTES   WBC Morphology ATYPICAL LYMPHOCYTES   Basic metabolic panel     Status: Abnormal   Collection Time: 12/03/14  1:52 AM  Result Value Ref Range   Sodium 133 (L) 135 - 145 mmol/L   Potassium 5.2 (H) 3.5 - 5.1 mmol/L   Chloride 100 (L) 101 - 111 mmol/L   CO2 25 22 - 32 mmol/L   Glucose, Bld 456 (H) 65 - 99 mg/dL   BUN 21 (H) 6 - 20 mg/dL   Creatinine, Ser 1.15 0.61 - 1.24 mg/dL   Calcium 9.1 8.9 - 10.3 mg/dL   GFR calc non Af Amer >60 >60 mL/min   GFR calc Af Amer >60 >60 mL/min    Comment: (NOTE) The eGFR has been calculated using the CKD EPI equation. This calculation has not been validated in all clinical situations. eGFR's persistently <60 mL/min signify possible Chronic Kidney Disease.    Anion gap 8 5 - 15  HIV antibody     Status: None   Collection Time: 12/03/14  7:15 AM  Result Value Ref Range   HIV Screen 4th Generation wRfx Non Reactive Non Reactive    Comment: (NOTE) Performed At: [facility] [place], [place] [id number] Lindon Romp MD [id number]   Prealbumin     Status: None   Collection Time: 12/03/14  7:15 AM  Result Value Ref Range   Prealbumin 20.8 18 - 38 mg/dL  Blood Cultures x 2 sites     Status: None (Preliminary result)   Collection Time: 12/03/14  7:15 AM  Result Value Ref Range   Specimen Description BLOOD RIGHT ARM    Special Requests BOTTLES DRAWN AEROBIC AND ANAEROBIC 10CC    Culture PENDING    Report Status PENDING   Creatinine, serum     Status: None   Collection Time: 12/03/14  7:15 AM  Result Value Ref Range   Creatinine, Ser 1.00 0.61 - 1.24 mg/dL   GFR calc non Af Amer >60 >60 mL/min   GFR calc Af Amer >60 >60 mL/min    Comment: (NOTE) The eGFR has been calculated using the CKD EPI equation. This calculation has not been validated in all clinical situations. eGFR's persistently <60 mL/min signify possible Chronic Kidney Disease.    LFT's     Status: Abnormal   Collection Time: 12/03/14  7:15 AM  Result Value Ref Range   Total Protein 7.3 6.5 - 8.1 g/dL   Albumin 3.1 (L) 3.5 - 5.0 g/dL   AST 27 15 - 41 U/L   ALT 47 17 - 63 U/L   Alkaline Phosphatase 95 38 - 126 U/L   Total Bilirubin 0.4 0.3 - 1.2 mg/dL   Bilirubin, Direct <0.1 (L) 0.1 - 0.5 mg/dL   Indirect Bilirubin NOT CALCULATED 0.3 - 0.9 mg/dL  Blood Cultures x 2 sites     Status: None (Preliminary result)   Collection Time: 12/03/14  7:23 AM  Result Value Ref Range   Specimen Description BLOOD RIGHT ARM    Special Requests BOTTLES DRAWN AEROBIC AND ANAEROBIC 10CC    Culture PENDING    Report Status PENDING   CK     Status: None   Collection Time: 12/03/14  7:23 AM  Result Value Ref Range   Total CK 221 49 - 397 U/L  Protime-INR     Status: None   Collection Time: 12/03/14  7:23 AM  Result Value Ref Range   Prothrombin Time 13.8 11.6 - 15.2 seconds   INR 1.04 0.00 - 1.49  Glucose, capillary     Status: Abnormal   Collection Time: 12/03/14  7:42 AM  Result Value Ref Range   Glucose-Capillary 294 (H) 65 - 99 mg/dL  Glucose, capillary     Status: Abnormal   Collection Time: 12/03/14 12:27 PM  Result Value Ref Range   Glucose-Capillary 311 (H) 65 - 99 mg/dL  Glucose, capillary     Status: Abnormal   Collection Time: 12/03/14  5:34 PM  Result Value Ref Range   Glucose-Capillary 249 (H) 65 - 99 mg/dL    Dg Foot Complete Left  12/03/2014   CLINICAL DATA:  Acute onset of left foot pain and erythema. Initial encounter.  EXAM: LEFT FOOT - COMPLETE 3+ VIEW  COMPARISON:  None.  FINDINGS: The patient is status post amputation of much of the first metatarsal. Degenerative change is noted at the second and third metatarsophalangeal joints. There is chronic resorption involving the distal aspects of the second and third toes. Would correlate clinically for any evidence of  underlying osteomyelitis. Associated soft tissue swelling is noted about the second and third  toes.  The visualized joint spaces are grossly preserved. An os peroneum is noted. Plantar and posterior calcaneal spurs are incidentally seen.  IMPRESSION: 1. Chronic resorption involving the distal aspects of the second and third toes. Would correlate clinically for any evidence of underlying osteomyelitis. 2. Status post amputation of much of the first metatarsal. Degenerative change at the second and third metatarsophalangeal joints. 3. Os peroneum noted.   Electronically Signed   By: Garald Balding M.D.   On: 12/03/2014 02:55    Review of Systems  All other systems reviewed and are negative.  Blood pressure 120/80, pulse 72, temperature 97.8 F (36.6 C), temperature source Oral, resp. rate 18, height $RemoveBe'6\' 4"'ZkakzhTlz$  (1.93 m), weight 133.811 kg (295 lb), SpO2 98 %. Physical Exam On examination patient is a good dorsalis pedis pulse on the left. He has swelling involving the foot and leg secondary to the forefoot infection. There is cellulitis involving the entire forefoot with necrotic gangrenous third toe with exposed bone. Patient is also status post a left foot first ray amputation. He has fixed clawing of the lesser toes. Assessment/Plan: Assessment: Cellulitis ulceration osteomyelitis left forefoot status post first ray amputation with fixed clawing of his toes. I discussed the patient feel is best option is a transmetatarsal amputation. I do not feel there is sufficient soft tissue envelope to heal an amputation of the third toe and with the fixed clawing of the second and fourth toes patient would quickly break these down and require additional surgery. I am concerned with the cellulitis extending into the MTP joint region and the infection may extend more proximal than it appears. Discussed that we will try to save as much of his foot as we can with a transmetatarsal amputation. Risks and benefits were discussed including the risk of the wound not healing. Patient states he understands wishes to proceed  at this time.  Kannan Proia V 12/03/2014, 6:01 PM

## 2014-12-04 NOTE — Anesthesia Procedure Notes (Signed)
Procedure Name: LMA Insertion Date/Time: 12/04/2014 11:06 AM Performed by: Sharlene Dory E Pre-anesthesia Checklist: Patient identified, Emergency Drugs available, Patient being monitored, Suction available and Timeout performed Patient Re-evaluated:Patient Re-evaluated prior to inductionOxygen Delivery Method: Circle system utilized Preoxygenation: Pre-oxygenation with 100% oxygen Intubation Type: IV induction Ventilation: Mask ventilation without difficulty LMA: LMA inserted LMA Size: 4.0 Number of attempts: 1 Placement Confirmation: positive ETCO2 and breath sounds checked- equal and bilateral Tube secured with: Tape Dental Injury: Teeth and Oropharynx as per pre-operative assessment

## 2014-12-04 NOTE — Transfer of Care (Signed)
Immediate Anesthesia Transfer of Care Note  Patient: Marc Miranda  Procedure(s) Performed: Procedure(s): tRANSMETATARSAL LEFT FOOT (Left)  Patient Location: PACU  Anesthesia Type:General  Level of Consciousness: awake, alert  and oriented  Airway & Oxygen Therapy: Patient Spontanous Breathing and Patient connected to nasal cannula oxygen  Post-op Assessment: Report given to RN, Post -op Vital signs reviewed and stable and Patient moving all extremities  Post vital signs: Reviewed and stable  Last Vitals:  Filed Vitals:   12/03/14 2132  BP: 129/85  Pulse: 72  Temp: 36.6 C  Resp: 20    Complications: No apparent anesthesia complications

## 2014-12-04 NOTE — Progress Notes (Signed)
Patient educated on use of incentive spirometer.Patient gave  return demonstration on proper usage  of incentive spirometer.

## 2014-12-04 NOTE — Progress Notes (Signed)
Orthopedic Tech Progress Note Patient Details:  Marc Miranda 06-Jul-1960 [id number]  Ortho Devices Type of Ortho Device: Postop shoe/boot Ortho Device/Splint Location: LLE Ortho Device/Splint Interventions: Ordered, Application   [name] 12/04/2014, 3:33 PM

## 2014-12-04 NOTE — Anesthesia Preprocedure Evaluation (Addendum)
Anesthesia Evaluation  Patient identified by MRN, date of birth, ID band Patient awake    Reviewed: Allergy & Precautions, NPO status , Patient's Chart, lab work & pertinent test results  Airway Mallampati: I   Neck ROM: full    Dental  (+) Poor Dentition   Pulmonary asthma , Current Smoker,  breath sounds clear to auscultation        Cardiovascular hypertension, Rhythm:regular Rate:Normal     Neuro/Psych    GI/Hepatic   Endo/Other  diabetes, Type 2obese  Renal/GU      Musculoskeletal   Abdominal   Peds  Hematology   Anesthesia Other Findings   Reproductive/Obstetrics                            Anesthesia Physical Anesthesia Plan  ASA: II  Anesthesia Plan: General   Post-op Pain Management:    Induction: Intravenous  Airway Management Planned: LMA  Additional Equipment:   Intra-op Plan:   Post-operative Plan:   Informed Consent: I have reviewed the patients History and Physical, chart, labs and discussed the procedure including the risks, benefits and alternatives for the proposed anesthesia with the patient or authorized representative who has indicated his/her understanding and acceptance.     Plan Discussed with: Anesthesiologist, Surgeon and CRNA  Anesthesia Plan Comments:         Anesthesia Quick Evaluation

## 2014-12-05 ENCOUNTER — Encounter (HOSPITAL_COMMUNITY): Payer: Self-pay | Admitting: Orthopedic Surgery

## 2014-12-05 LAB — GLUCOSE, CAPILLARY
Glucose-Capillary: 240 mg/dL — ABNORMAL HIGH (ref 65–99)
Glucose-Capillary: 184 mg/dL — ABNORMAL HIGH (ref 65–99)

## 2014-12-05 MED ORDER — OXYCODONE HCL ER 10 MG PO T12A: 30.0000 mg | EXTENDED_RELEASE_TABLET | Freq: Two times a day (BID) | ORAL | Status: DC

## 2014-12-05 MED ORDER — OXYCODONE HCL 5 MG PO TABS: 5.0000 mg | ORAL_TABLET | ORAL | Status: DC | PRN

## 2014-12-05 NOTE — Care Management Note (Signed)
Case Management Note  Patient Details  Name: Marc Miranda MRN: [id number] Date of Birth: May 12, 1960  Subjective/Objective:           CM following for progression and d/c planning.         Action/Plan:  12/05/2014 Met with pt re d/c needs, no HHPT or rehab recommended, DME, walker and 3:1 ordered and will be delivered to pt room.  Expected Discharge Date:        12/05/2014          Expected Discharge Plan:  Home/Self Care  In-House Referral:  NA  Discharge planning Services  CM Consult  Post Acute Care Choice:  Durable Medical Equipment Choice offered to:  Patient  DME Arranged:  Gilford Rile wide, Bedside commode DME Agency:  [place]:  NA [place] Agency:  NA  Status of Service:  Completed, signed off  Medicare Important Message Given:    Date Medicare IM Given:    Medicare IM give by:    Date Additional Medicare IM Given:    Additional Medicare Important Message give by:     If discussed at [place] of Stay Meetings, dates discussed:    Additional Comments:  Adron Bene, RN 12/05/2014, 2:20 PM

## 2014-12-05 NOTE — Progress Notes (Signed)
Patient ID: Marc Miranda, male   DOB: [birth year], 54 y.o.   MRN: [id number] Postoperative day 1 left transmetatarsal amputation. Patient states that he is comfortable. Physical therapy progressive ambulation nonweightbearing on the left. Anticipated patient can be discharged to home once he is safe with therapy. Patient does not need antibiotics for discharge. Continue IV antibiotics until ready for discharge.

## 2014-12-05 NOTE — Evaluation (Addendum)
Physical Therapy Evaluation Patient Details Name: Marc Miranda MRN: [id number] DOB: [birth year] Today's Date: 12/05/2014   History of Present Illness  Pt with DM, and neuropathy s/p Left transmet amputation  Clinical Impression  Pt very pleasant and moving well. Pt and wife educated for gait, step, HEP and desensitization of left foot. Pt able to demonstrate good use of RW and hopping with gait with education for shoe for right foot to decrease impact. Will follow acutely to maximize independence and gait but do not anticipate need outside of the hospital. Pt and wife verbalized understanding of all education.     Follow Up Recommendations No PT follow up    Equipment Recommendations  Rolling walker with 5" wheels;3in1 (PT)    Recommendations for Other Services       Precautions / Restrictions Precautions Precautions: Fall Required Braces or Orthoses: Other Brace/Splint Other Brace/Splint: post op shoe Restrictions Weight Bearing Restrictions: Yes LLE Weight Bearing: Non weight bearing      Mobility  Bed Mobility Overal bed mobility: Modified Independent                Transfers Overall transfer level: Modified independent                  Ambulation/Gait Ambulation/Gait assistance: Supervision Ambulation Distance (Feet): 100 Feet Assistive device: Rolling walker (2 wheeled) Gait Pattern/deviations: Step-to pattern   Gait velocity interpretation: Below normal speed for age/gender General Gait Details: cues for position in RW, safety and sequence  Stairs            Wheelchair Mobility    Modified Rankin (Stroke Patients Only)       Balance                                             Pertinent Vitals/Pain Pain Assessment: 0-10 Pain Score: 8  Pain Location: left foot Pain Descriptors / Indicators: Aching Pain Intervention(s): Premedicated before session;Repositioned;Limited activity within patient's tolerance;Monitored  during session    Home Living Family/patient expects to be discharged to:: Private residence Living Arrangements: Spouse/significant other Available Help at Discharge: Family;Available 24 hours/day Type of Home: Apartment Home Access: Stairs to enter   Entrance Stairs-Number of Steps: 1 Home Layout: One level Home Equipment: None      Prior Function Level of Independence: Independent               Hand Dominance        Extremity/Trunk Assessment   Upper Extremity Assessment: Overall WFL for tasks assessed           Lower Extremity Assessment: Overall WFL for tasks assessed      Cervical / Trunk Assessment: Normal  Communication   Communication: No difficulties  Cognition Arousal/Alertness: Awake/alert Behavior During Therapy: WFL for tasks assessed/performed Overall Cognitive Status: Within Functional Limits for tasks assessed                      General Comments      Exercises        Assessment/Plan    PT Assessment Patient needs continued PT services  PT Diagnosis Abnormality of gait;Acute pain   PT Problem List Decreased activity tolerance;Decreased balance;Decreased mobility;Pain;Decreased knowledge of use of DME  PT Treatment Interventions Gait training;Stair training;DME instruction;Functional mobility training;Patient/family education   PT Goals (Current goals can be found in the  Care Plan section) Acute Rehab PT Goals Patient Stated Goal: go home PT Goal Formulation: With patient/family Time For Goal Achievement: 12/12/14 Potential to Achieve Goals: Good    Frequency Min 3X/week   Barriers to discharge        Co-evaluation               End of Session Equipment Utilized During Treatment: Gait belt Activity Tolerance: Patient tolerated treatment well Patient left: in chair;with call bell/phone within reach;with family/visitor present Nurse Communication: Mobility status         Time: 1610-9604 PT Time  Calculation (min) (ACUTE ONLY): 27 min   Charges:   PT Evaluation $Initial PT Evaluation Tier I: 1 Procedure PT Treatments $Therapeutic Activity: 8-22 mins   PT G CodesDelorse Lek 12/05/2014, 11:24 AM Delaney Meigs, PT [phone]

## 2014-12-05 NOTE — Discharge Summary (Signed)
Physician Discharge Summary  Drayton Tieu ZOX:[id number] DOB: 1960/05/20 DOA: 12/03/2014  PCP: No PCP Per Patient  Admit date: 12/03/2014 Discharge date: 12/05/2014  Time spent: > 35 minutes  Recommendations for Outpatient Follow-up:  1. Patient to follow-up with orthopedic surgeon 2. Monitor blood sugars and adjust hypoglycemic agents accordingly  Discharge Diagnoses:  Principal Problem:   Diabetic foot ulcer with osteomyelitis Active Problems:   Cellulitis   Type 2 diabetes mellitus   Essential hypertension   History of amputation   Discharge Condition: stable  Diet recommendation: carb modified  Filed Weights   12/03/14 0607 12/03/14 2132 12/04/14 2107  Weight: 133.811 kg (295 lb) 134.718 kg (297 lb) 134.9 kg (297 lb 6.4 oz)    History of present illness:  From original history of present illness 54 y.o. male with Past medical history of diabetes mellitus, hypertension, anemia, neuropathy, amputation of the left great toe. The patient is presenting with complaints of nonhealing ulcer of the left second and third toe. Upon further evaluation patient was found to have osteomyelitis. Orthopedic surgeon consulted.   Hospital Course:  Diabetic foot ulcer with osteomyelitis - Orthopedic surgeon consulted and patient is status post TMA - Will provide prescription for amelioration of breakthrough pain - Patient is afebrile and WBC within normal limits. No abx recommended on d/c by ortho  DM - Pt to continue home medication regimen  HTN - Will continue patient's home regimen  Procedures:  Left TMA  Consultations:  OrthoLajoyce Corners  Discharge Exam: Filed Vitals:   12/05/14 1000  BP: 105/64  Pulse: 72  Temp: 98.2 F (36.8 C)  Resp: 18    General: Pt in nad, alert and awake Cardiovascular: rrr, no mrg Respiratory: cta bl, no wheezes  Discharge Instructions   Discharge Instructions    Call MD for:  redness, tenderness, or signs of infection (pain, swelling,  redness, odor or green/yellow discharge around incision site)    Complete by:  As directed      Call MD for:  severe uncontrolled pain    Complete by:  As directed      Call MD for:  temperature >100.4    Complete by:  As directed      Diet - low sodium heart healthy    Complete by:  As directed      Discharge instructions    Complete by:  As directed   Please follow up with orthopaedic surgery in 1-2 weeks or sooner should any new concerns arise.     Elevate operative extremity    Complete by:  As directed      Increase activity slowly    Complete by:  As directed      Non weight bearing    Complete by:  As directed   Laterality:  left  Extremity:  Lower     Post-op shoe    Complete by:  As directed           Current Discharge Medication List    START taking these medications   Details  oxyCODONE (OXY IR/ROXICODONE) 5 MG immediate release tablet Take 1 tablet (5 mg total) by mouth every 4 (four) hours as needed for breakthrough pain. Qty: 30 tablet, Refills: 0      CONTINUE these medications which have NOT CHANGED   Details  glimepiride (AMARYL) 2 MG tablet Take 2 mg by mouth daily.    HYDROcodone-acetaminophen (NORCO) 10-325 MG per tablet Take 1 tablet by mouth every 6 (six) hours as needed.  insulin glargine (LANTUS) 100 UNIT/ML injection Inject 60 Units into the skin at bedtime.    lisinopril (PRINIVIL,ZESTRIL) 10 MG tablet Take 10 mg by mouth daily.    metFORMIN (GLUCOPHAGE) 1000 MG tablet Take 1,000 mg by mouth 2 (two) times daily with a meal.    omeprazole (PRILOSEC) 40 MG capsule Take 40 mg by mouth daily.    saxagliptin HCl (ONGLYZA) 5 MG TABS tablet Take 5 mg by mouth daily.    ibuprofen (ADVIL,MOTRIN) 800 MG tablet Take 1 tablet (800 mg total) by mouth 3 (three) times daily. Qty: 21 tablet, Refills: 0      STOP taking these medications     ciprofloxacin (CIPRO) 500 MG tablet      clindamycin (CLEOCIN) 300 MG capsule      ciprofloxacin (CIPRO) 750  MG tablet      cyclobenzaprine (FLEXERIL) 10 MG tablet      traMADol (ULTRAM) 50 MG tablet        No Known Allergies Follow-up Information    Follow up with DUDA,MARCUS V, MD In 2 weeks.   Specialty:  Orthopedic Surgery   Contact information:   [address] [place] [address] [phone]        The results of significant diagnostics from this hospitalization (including imaging, microbiology, ancillary and laboratory) are listed below for reference.    Significant Diagnostic Studies: Dg Foot Complete Left  12/03/2014   CLINICAL DATA:  Acute onset of left foot pain and erythema. Initial encounter.  EXAM: LEFT FOOT - COMPLETE 3+ VIEW  COMPARISON:  None.  FINDINGS: The patient is status post amputation of much of the first metatarsal. Degenerative change is noted at the second and third metatarsophalangeal joints. There is chronic resorption involving the distal aspects of the second and third toes. Would correlate clinically for any evidence of underlying osteomyelitis. Associated soft tissue swelling is noted about the second and third toes.  The visualized joint spaces are grossly preserved. An os peroneum is noted. Plantar and posterior calcaneal spurs are incidentally seen.  IMPRESSION: 1. Chronic resorption involving the distal aspects of the second and third toes. Would correlate clinically for any evidence of underlying osteomyelitis. 2. Status post amputation of much of the first metatarsal. Degenerative change at the second and third metatarsophalangeal joints. 3. Os peroneum noted.   Electronically Signed   By: Roanna Raider M.D.   On: 12/03/2014 02:55    Microbiology: Recent Results (from the past 240 hour(s))  Blood Cultures x 2 sites     Status: None (Preliminary result)   Collection Time: 12/03/14  7:15 AM  Result Value Ref Range Status   Specimen Description BLOOD RIGHT ARM  Final   Special Requests BOTTLES DRAWN AEROBIC AND ANAEROBIC 10CC  Final   Culture NO  GROWTH 1 DAY  Final   Report Status PENDING  Incomplete  Blood Cultures x 2 sites     Status: None (Preliminary result)   Collection Time: 12/03/14  7:23 AM  Result Value Ref Range Status   Specimen Description BLOOD RIGHT ARM  Final   Special Requests BOTTLES DRAWN AEROBIC AND ANAEROBIC 10CC  Final   Culture NO GROWTH 1 DAY  Final   Report Status PENDING  Incomplete  Surgical pcr screen     Status: None   Collection Time: 12/03/14  9:36 PM  Result Value Ref Range Status   MRSA, PCR NEGATIVE NEGATIVE Final   Staphylococcus aureus NEGATIVE NEGATIVE Final    Comment:  The Xpert SA Assay (FDA approved for NASAL specimens in patients over 23 years of age), is one component of a comprehensive surveillance program.  Test performance has been validated by [facility] for patients greater than or equal to 94 year old. It is not intended to diagnose infection nor to guide or monitor treatment.      Labs: Basic Metabolic Panel:  Recent Labs Lab 12/03/14 0152 12/03/14 0715 12/03/14 2052 12/04/14 0601  NA 133*  --  136 137  K 5.2*  --  4.8 4.3  CL 100*  --  102 103  CO2 25  --  28 28  GLUCOSE 456*  --  387* 307*  BUN 21*  --  16 15  CREATININE 1.15 1.00 1.09 0.91  CALCIUM 9.1  --  9.0 9.0   Liver Function Tests:  Recent Labs Lab 12/03/14 0715 12/03/14 2052  AST 27 24  ALT 47 44  ALKPHOS 95 75  BILITOT 0.4 0.3  PROT 7.3 6.5  ALBUMIN 3.1* 2.9*   No results for input(s): LIPASE, AMYLASE in the last 168 hours. No results for input(s): AMMONIA in the last 168 hours. CBC:  Recent Labs Lab 12/03/14 0152 12/03/14 2052 12/04/14 0601  WBC 7.1 6.1 6.4  NEUTROABS 3.5 2.7  --   HGB 12.4* 11.3* 11.8*  HCT 37.1* 35.4* 36.8*  MCV 69.5* 70.0* 70.8*  PLT 170 168 182   Cardiac Enzymes:  Recent Labs Lab 12/03/14 0723  CKTOTAL 221   BNP: BNP (last 3 results) No results for input(s): BNP in the last 8760 hours.  ProBNP (last 3 results) No results for  input(s): PROBNP in the last 8760 hours.  CBG:  Recent Labs Lab 12/04/14 0759 12/04/14 1147 12/04/14 1633 12/04/14 2105 12/05/14 0747  GLUCAP 251* 210* 232* 288* 240*     Signed:  Penny Pia  [facility] 12/05/2014, 11:50 AM

## 2014-12-05 NOTE — Progress Notes (Signed)
Pt discharge instructions and prescription given, pt verbalized understanding. VSS. Denies pain at this time. Pt left floor via wheelchair accompanied by staff and family.

## 2014-12-06 NOTE — Anesthesia Postprocedure Evaluation (Signed)
Anesthesia Post Note  Patient: Marc Miranda  Procedure(s) Performed: Procedure(s) (LRB): tRANSMETATARSAL LEFT FOOT (Left)  Anesthesia type: General  Patient location: PACU  Post pain: Pain level controlled and Adequate analgesia  Post assessment: Post-op Vital signs reviewed, Patient's Cardiovascular Status Stable, Respiratory Function Stable, Patent Airway and Pain level controlled  Last Vitals:  Filed Vitals:   12/05/14 1000  BP: 105/64  Pulse: 72  Temp: 36.8 C  Resp: 18    Post vital signs: Reviewed and stable  Level of consciousness: awake, alert  and oriented  Complications: No apparent anesthesia complications

## 2014-12-08 LAB — CULTURE, BLOOD (ROUTINE X 2)
Culture: NO GROWTH
Culture: NO GROWTH

## 2015-12-02 DEATH — deceased

## 2016-04-07 ENCOUNTER — Encounter (HOSPITAL_COMMUNITY): Payer: Self-pay

## 2016-04-07 ENCOUNTER — Emergency Department (HOSPITAL_COMMUNITY)
Admission: EM | Admit: 2016-04-07 | Discharge: 2016-04-07 | Disposition: A | Discharge status: Home / Self Care | Payer: Medicaid - Out of State | Attending: Emergency Medicine | Admitting: Emergency Medicine

## 2016-04-07 ENCOUNTER — Emergency Department (HOSPITAL_COMMUNITY): Payer: Medicaid - Out of State

## 2016-04-07 DIAGNOSIS — R1013 Epigastric pain: Secondary | ICD-10-CM | POA: Diagnosis not present

## 2016-04-07 DIAGNOSIS — Z7984 Long term (current) use of oral hypoglycemic drugs: Secondary | ICD-10-CM | POA: Insufficient documentation

## 2016-04-07 DIAGNOSIS — E11621 Type 2 diabetes mellitus with foot ulcer: Secondary | ICD-10-CM | POA: Diagnosis not present

## 2016-04-07 DIAGNOSIS — L97509 Non-pressure chronic ulcer of other part of unspecified foot with unspecified severity: Secondary | ICD-10-CM | POA: Insufficient documentation

## 2016-04-07 DIAGNOSIS — F17210 Nicotine dependence, cigarettes, uncomplicated: Secondary | ICD-10-CM | POA: Insufficient documentation

## 2016-04-07 DIAGNOSIS — Z79899 Other long term (current) drug therapy: Secondary | ICD-10-CM | POA: Insufficient documentation

## 2016-04-07 DIAGNOSIS — J45909 Unspecified asthma, uncomplicated: Secondary | ICD-10-CM | POA: Diagnosis not present

## 2016-04-07 DIAGNOSIS — R1010 Upper abdominal pain, unspecified: Secondary | ICD-10-CM | POA: Diagnosis present

## 2016-04-07 DIAGNOSIS — I10 Essential (primary) hypertension: Secondary | ICD-10-CM | POA: Diagnosis not present

## 2016-04-07 DIAGNOSIS — R109 Unspecified abdominal pain: Secondary | ICD-10-CM

## 2016-04-07 LAB — BASIC METABOLIC PANEL
ANION GAP: 7 (ref 5–15)
BUN: 23 mg/dL — AB (ref 6–20)
CALCIUM: 9.4 mg/dL (ref 8.9–10.3)
CO2: 25 mmol/L (ref 22–32)
Chloride: 99 mmol/L — ABNORMAL LOW (ref 101–111)
Creatinine, Ser: 1.47 mg/dL — ABNORMAL HIGH (ref 0.61–1.24)
GFR calc Af Amer: >60 mL/min (ref >60)
GFR, EST NON AFRICAN AMERICAN: 53 mL/min — AB (ref >60)
GLUCOSE: 168 mg/dL — AB (ref 65–99)
Potassium: 5.2 mmol/L — ABNORMAL HIGH (ref 3.5–5.1)
Sodium: 131 mmol/L — ABNORMAL LOW (ref 135–145)

## 2016-04-07 LAB — URINALYSIS, ROUTINE W REFLEX MICROSCOPIC
BILIRUBIN URINE: NEGATIVE
Glucose, UA: NEGATIVE mg/dL
HGB URINE DIPSTICK: NEGATIVE
Ketones, ur: NEGATIVE mg/dL
Leukocytes, UA: NEGATIVE
NITRITE: NEGATIVE
Protein, ur: NEGATIVE mg/dL
SPECIFIC GRAVITY, URINE: 1.016 (ref 1.005–1.030)
pH: 5 (ref 5.0–8.0)

## 2016-04-07 LAB — CBC
HCT: 42 % (ref 39.0–52.0)
HEMOGLOBIN: 13.9 g/dL (ref 13.0–17.0)
MCH: 22.6 pg — AB (ref 26.0–34.0)
MCHC: 33.1 g/dL (ref 30.0–36.0)
MCV: 68.2 fL — ABNORMAL LOW (ref 78.0–100.0)
Platelets: 236 10*3/uL (ref 150–400)
RBC: 6.16 MIL/uL — ABNORMAL HIGH (ref 4.22–5.81)
RDW: 15 % (ref 11.5–15.5)
WBC: 8.2 10*3/uL (ref 4.0–10.5)

## 2016-04-07 LAB — CBG MONITORING, ED: GLUCOSE-CAPILLARY: 173 mg/dL — AB (ref 65–99)

## 2016-04-07 MED ORDER — SODIUM CHLORIDE 0.9 % IV BOLUS (SEPSIS)
1000.0000 mL | Freq: Once | INTRAVENOUS | Status: AC
  Administered 2016-04-07: 1000 mL via INTRAVENOUS

## 2016-04-07 MED ORDER — GI COCKTAIL ~~LOC~~
30.0000 mL | Freq: Once | ORAL | Status: AC
  Administered 2016-04-07: 30 mL via ORAL
  Filled 2016-04-07: qty 30

## 2016-04-07 NOTE — Discharge Instructions (Signed)
It was my pleasure taking care of you today!  Take your omeprazole each day as directed. Please seek immediate care if you develop any of the following symptoms: The pain does not go away.  You have a fever.  You throw up (vomiting).  You pass bloody or black tarry stools.  There is bright red blood in the stool.  Constipation stays for more than 4 days.  There is rectal pain.  You do not seem to be getting better.  You have any questions or concerns.

## 2016-04-07 NOTE — ED Notes (Signed)
Pt transported to US

## 2016-04-07 NOTE — ED Provider Notes (Signed)
[facility]-EMERGENCY DEPT Provider Note   CSN: [id number] Arrival date & time: 04/07/16  1248     History   Chief Complaint Chief Complaint  Patient presents with  . Abdominal Pain  . Dizziness    HPI Marc DallasJohn Jerome is a 55 y.o. male.  The history is provided by the patient and medical records. No language interpreter was used.     Marc Miranda is a 55 y.o. male  with a PMH of GERD, DM, asthma who presents to the Emergency Department complaining of upper abdominal pain x 5 days. Pain is described as intermittent burning pain. He tried mag citrate with little relief. Endorses decreased appetite and feeling more tired than usual but otherwise no complaints. He denies n/v/d, constipation, fevers, back pain. Patient states that he will occasionally get dizzy when he walks around or stands up quickly but this has not occurred in several months.   Past Medical History:  Diagnosis Date  . Asthma   . Diabetes mellitus without complication [facility](HCC)     Patient Active Problem List   Diagnosis Date Noted  . Diabetic foot ulcer with osteomyelitis (HCC) 12/03/2014  . Cellulitis 12/03/2014  . Type 2 diabetes mellitus (HCC) 12/03/2014  . Essential hypertension 12/03/2014  . History of amputation 12/03/2014    Past Surgical History:  Procedure Laterality Date  . AMPUTATION Left 12/04/2014   Procedure: tRANSMETATARSAL LEFT FOOT;  Surgeon: Nadara MustardMarcus Duda V, MD;  Location: [facility] OR;  Service: Orthopedics;  Laterality: Left;  . TOE AMPUTATION         Home Medications    Prior to Admission medications   Medication Sig Start Date End Date Taking? Authorizing Provider  DULoxetine (CYMBALTA) 30 MG capsule Take 30 mg by mouth daily as needed for pain. 02/10/16  Yes Historical Provider, MD  glimepiride (AMARYL) 2 MG tablet Take 2 mg by mouth daily. 11/23/14  Yes Historical Provider, MD  HYDROcodone-acetaminophen (NORCO) 10-325 MG per tablet Take 1 tablet by mouth every 6 (six) hours as needed. 12/01/14   Yes Historical Provider, MD  lisinopril (PRINIVIL,ZESTRIL) 10 MG tablet Take 10 mg by mouth daily.   Yes Historical Provider, MD  metFORMIN (GLUCOPHAGE) 1000 MG tablet Take 1,000 mg by mouth 2 (two) times daily with a meal.   Yes Historical Provider, MD  montelukast (SINGULAIR) 10 MG tablet Take 10 mg by mouth daily as needed for allergies. 02/25/16  Yes Historical Provider, MD  omeprazole (PRILOSEC) 40 MG capsule Take 40 mg by mouth daily. 09/26/13  Yes Historical Provider, MD  saxagliptin HCl (ONGLYZA) 5 MG TABS tablet Take 5 mg by mouth daily.   Yes Historical Provider, MD  TOUJEO SOLOSTAR 300 UNIT/ML SOPN Inject 60 Units into the skin at bedtime. 02/29/16  Yes Historical Provider, MD  ibuprofen (ADVIL,MOTRIN) 800 MG tablet Take 1 tablet (800 mg total) by mouth 3 (three) times daily. Patient not taking: Reported on 12/03/2014 10/15/14   Teressa LowerVrinda Pickering, NP    Family History No family history on file.  Social History Social History  Substance Use Topics  . Smoking status: Current Every Day Smoker    Packs/day: 0.50    Years: 20.00    Types: Cigarettes  . Smokeless tobacco: Never Used     Comment: uses e cigarette  as needed  . Alcohol use No     Allergies   Patient has no known allergies.   Review of Systems Review of Systems  Constitutional: Positive for fatigue. Negative for fever.  HENT: Negative  for congestion.   Eyes: Negative for visual disturbance.  Respiratory: Negative for cough and shortness of breath.   Cardiovascular: Negative.   Gastrointestinal: Positive for abdominal pain. Negative for blood in stool, constipation, diarrhea, nausea and vomiting.  Genitourinary: Negative for dysuria.  Musculoskeletal: Negative for back pain and neck pain.  Skin: Negative for rash.  Neurological: Negative for headaches.     Physical Exam Updated Vital Signs BP 110/70   Pulse 78   Temp 97.6 F (36.4 C) (Oral)   Resp 11   Ht 6\' 4"  (1.93 m)   Wt 133.4 kg   SpO2 93%    BMI 35.79 kg/m   Physical Exam  Constitutional: He is oriented to person, place, and time. He appears well-developed and well-nourished. No distress.  Non-toxic appearing.  HENT:  Head: Normocephalic and atraumatic.  Cardiovascular: Normal rate, regular rhythm and normal heart sounds.   No murmur heard. Pulmonary/Chest: Effort normal and breath sounds normal. No respiratory distress.  Abdominal: Soft. Bowel sounds are normal. He exhibits no distension. There is tenderness (Epigastrium). There is no rebound and no guarding.  Musculoskeletal: He exhibits no edema.  Neurological: He is alert and oriented to person, place, and time.  Skin: Skin is warm and dry.  Nursing note and vitals reviewed.    ED Treatments / Results  Labs (all labs ordered are listed, but only abnormal results are displayed) Labs Reviewed  BASIC METABOLIC PANEL - Abnormal; Notable for the following:       Result Value   Sodium 131 (*)    Potassium 5.2 (*)    Chloride 99 (*)    Glucose, Bld 168 (*)    BUN 23 (*)    Creatinine, Ser 1.47 (*)    GFR calc non Af Amer 53 (*)    All other components within normal limits  CBC - Abnormal; Notable for the following:    RBC 6.16 (*)    MCV 68.2 (*)    MCH 22.6 (*)    All other components within normal limits  URINALYSIS, ROUTINE W REFLEX MICROSCOPIC - Abnormal; Notable for the following:    APPearance HAZY (*)    All other components within normal limits  CBG MONITORING, ED - Abnormal; Notable for the following:    Glucose-Capillary 173 (*)    All other components within normal limits    EKG  EKG Interpretation  Date/Time:  Wednesday April 07 2016 13:01:06 EST Ventricular Rate:  90 PR Interval:  146 QRS Duration: 74 QT Interval:  348 QTC Calculation: 425 R Axis:   49 Text Interpretation:  Normal sinus rhythm Normal ECG Confirmed by Rubin PayorPICKERING  MD, NATHAN [id number]) on 04/07/2016 5:49:31 PM       Radiology [place] Abdomen Complete  Result Date:  04/07/2016 CLINICAL DATA:  Left upper quadrant abdominal pain for past 3 days EXAM: ABDOMEN ULTRASOUND COMPLETE COMPARISON:  None. FINDINGS: Gallbladder: No gallstones or wall thickening visualized. Gallbladder wall is 2.4 mm in thickness. No sonographic Murphy sign noted by sonographer. Common bile duct: Diameter: 3.3 mm and normal. No choledocholithiasis. Liver: No focal lesion identified. Echogenic consistent with hepatic steatosis. IVC: No abnormality visualized. Pancreas: Not visualized due to overlying bowel gas. Spleen: Size and appearance within normal limits. Maximum sagittal length approximately 9.4 cm. Right Kidney: Length: 11.5 cm. Echogenicity within normal limits. No mass or hydronephrosis visualized. Left Kidney: Length: 11.7 cm. Echogenicity within normal limits. No mass or hydronephrosis visualized. Abdominal aorta: The proximal abdominal aorta is not aneurysmal.  The mid to distal aorta and bifurcation were obscured by bowel gas. Other findings: Patent main portal vein. IMPRESSION: Fatty appearing liver. No sonographic findings for the patient's left upper quadrant pain. Electronically Signed   By: Tollie Eth M.D.   On: 04/07/2016 18:14    Procedures Procedures (including critical care time)  Medications Ordered in ED Medications  gi cocktail (Maalox,Lidocaine,Donnatal) (30 mLs Oral Given 04/07/16 1557)  sodium chloride 0.9 % bolus 1,000 mL (0 mLs Intravenous Stopped 04/07/16 1730)     Initial Impression / Assessment and Plan / ED Course  I have reviewed the triage vital signs and the nursing notes.  Pertinent labs & imaging results that were available during my care of the patient were reviewed by me and considered in my medical decision making (see chart for details).  Clinical Course    Haydon Dorris is a 56 y.o. male who presents to ED for five days of burning epigastric abdominal pain. On exam, patient with ttp of epigastrium. Negative Murphy's. No peritoneal signs. He is  afebrile, well appearing and hemodynamically stable. Labs with elevated BUN and creatine from baseline. Glucose elevated but no signs of DKA. Urine negative. White count normal. Ultrasound with no acute findings. Sxs toady sound most consistent with gastritis.   Patient is nontoxic, nonseptic appearing, in no apparent distress. Patient's pain and other symptoms adequately managed in emergency department. Fluid bolus given.Patient does not meet the SIRS or Sepsis criteria. On repeat exam patient does not have a surgical abdomen and there are no peritoneal signs. No indication of appendicitis, bowel obstruction, bowel perforation, cholecystitis, diverticulitis. Patient discharged home with symptomatic treatment and given strict instructions for follow-up with their primary care physician. Apparently has not been compliant with omeprazole, therefore encouraged him to take this medication daily. I have also discussed reasons to return immediately to the ER. Patient expresses understanding and agrees with plan.   Final Clinical Impressions(s) / ED Diagnoses   Final diagnoses:  Abdominal pain    New Prescriptions New Prescriptions   No medications on file     [facility], PA-C 04/07/16 1856    Benjiman Core, MD 04/07/16 [phone]

## 2016-04-07 NOTE — ED Notes (Signed)
Patient made aware of need of urine specimen; urinal handed to patient to attempt to provide a sample when he can

## 2016-04-07 NOTE — ED Notes (Signed)
Asher MuirJamie, PA at bedside to discuss results w/ pt.

## 2016-04-07 NOTE — ED Triage Notes (Signed)
Patient complains of abdominal pain with dizziness and fatigue since Saturday. Denies nausea, no vomiting, no diarrhea. States that the dizziness occurs with ambulation

## 2016-04-07 NOTE — ED Notes (Signed)
Brought patient back to room with family in tow; patient undressed, in gown, on monitor, continuous pulse oximetry and blood pressure cuff; visitor at bedside; warm blankets given

## 2016-04-10 ENCOUNTER — Encounter (HOSPITAL_COMMUNITY): Payer: Self-pay

## 2016-04-10 ENCOUNTER — Emergency Department (HOSPITAL_COMMUNITY)
Admission: EM | Admit: 2016-04-10 | Discharge: 2016-04-11 | Disposition: A | Discharge status: Home / Self Care | Payer: Medicaid - Out of State | Attending: Emergency Medicine | Admitting: Emergency Medicine

## 2016-04-10 DIAGNOSIS — E119 Type 2 diabetes mellitus without complications: Secondary | ICD-10-CM | POA: Insufficient documentation

## 2016-04-10 DIAGNOSIS — I10 Essential (primary) hypertension: Secondary | ICD-10-CM | POA: Diagnosis not present

## 2016-04-10 DIAGNOSIS — R1084 Generalized abdominal pain: Secondary | ICD-10-CM | POA: Diagnosis present

## 2016-04-10 DIAGNOSIS — J45909 Unspecified asthma, uncomplicated: Secondary | ICD-10-CM | POA: Diagnosis not present

## 2016-04-10 DIAGNOSIS — F17210 Nicotine dependence, cigarettes, uncomplicated: Secondary | ICD-10-CM | POA: Insufficient documentation

## 2016-04-10 DIAGNOSIS — K859 Acute pancreatitis without necrosis or infection, unspecified: Secondary | ICD-10-CM | POA: Diagnosis not present

## 2016-04-10 DIAGNOSIS — Z794 Long term (current) use of insulin: Secondary | ICD-10-CM | POA: Insufficient documentation

## 2016-04-10 DIAGNOSIS — R1013 Epigastric pain: Secondary | ICD-10-CM

## 2016-04-10 LAB — URINALYSIS, ROUTINE W REFLEX MICROSCOPIC
Bacteria, UA: NONE SEEN
Bilirubin Urine: NEGATIVE
Glucose, UA: NEGATIVE mg/dL
Ketones, ur: NEGATIVE mg/dL
Leukocytes, UA: NEGATIVE
Nitrite: NEGATIVE
PROTEIN: 100 mg/dL — AB
SPECIFIC GRAVITY, URINE: 1.018 (ref 1.005–1.030)
pH: 5 (ref 5.0–8.0)

## 2016-04-10 LAB — CBC
HCT: 40.6 % (ref 39.0–52.0)
Hemoglobin: 13.4 g/dL (ref 13.0–17.0)
MCH: 22.5 pg — AB (ref 26.0–34.0)
MCHC: 33 g/dL (ref 30.0–36.0)
MCV: 68.1 fL — AB (ref 78.0–100.0)
PLATELETS: 238 10*3/uL (ref 150–400)
RBC: 5.96 MIL/uL — ABNORMAL HIGH (ref 4.22–5.81)
RDW: 14.8 % (ref 11.5–15.5)
WBC: 7.4 10*3/uL (ref 4.0–10.5)

## 2016-04-10 LAB — COMPREHENSIVE METABOLIC PANEL
ALBUMIN: 3.6 g/dL (ref 3.5–5.0)
ALT: 14 U/L — AB (ref 17–63)
AST: 15 U/L (ref 15–41)
Alkaline Phosphatase: 73 U/L (ref 38–126)
Anion gap: 9 (ref 5–15)
BUN: 15 mg/dL (ref 6–20)
CHLORIDE: 99 mmol/L — AB (ref 101–111)
CO2: 23 mmol/L (ref 22–32)
CREATININE: 1.28 mg/dL — AB (ref 0.61–1.24)
Calcium: 9.7 mg/dL (ref 8.9–10.3)
GFR calc Af Amer: >60 mL/min (ref >60)
GLUCOSE: 187 mg/dL — AB (ref 65–99)
Potassium: 4.4 mmol/L (ref 3.5–5.1)
Sodium: 131 mmol/L — ABNORMAL LOW (ref 135–145)
Total Bilirubin: 0.2 mg/dL — ABNORMAL LOW (ref 0.3–1.2)
Total Protein: 7.7 g/dL (ref 6.5–8.1)

## 2016-04-10 LAB — LIPASE, BLOOD: LIPASE: 130 U/L — AB (ref 11–51)

## 2016-04-10 MED ORDER — ACETAMINOPHEN 500 MG PO TABS
1000.0000 mg | ORAL_TABLET | Freq: Once | ORAL | Status: AC
  Administered 2016-04-10: 1000 mg via ORAL
  Filled 2016-04-10: qty 2

## 2016-04-10 MED ORDER — SODIUM CHLORIDE 0.9 % IV BOLUS (SEPSIS)
1000.0000 mL | Freq: Once | INTRAVENOUS | Status: AC
  Administered 2016-04-10: 1000 mL via INTRAVENOUS

## 2016-04-10 MED ORDER — OXYCODONE-ACETAMINOPHEN 5-325 MG PO TABS: 1.0000 | ORAL_TABLET | Freq: Once | ORAL | Status: DC

## 2016-04-10 MED ORDER — ONDANSETRON HCL 4 MG/2ML IJ SOLN
4.0000 mg | Freq: Once | INTRAMUSCULAR | Status: AC
  Administered 2016-04-10: 4 mg via INTRAVENOUS
  Filled 2016-04-10: qty 2

## 2016-04-10 MED ORDER — OXYCODONE HCL 5 MG PO TABS
5.0000 mg | ORAL_TABLET | Freq: Once | ORAL | Status: AC
  Administered 2016-04-11: 5 mg via ORAL
  Filled 2016-04-10: qty 1

## 2016-04-10 MED ORDER — POLYETHYLENE GLYCOL 3350 17 G PO PACK
51.0000 g | PACK | Freq: Every day | ORAL | Status: DC
  Administered 2016-04-10: 51 g via ORAL
  Filled 2016-04-10: qty 3

## 2016-04-10 NOTE — ED Triage Notes (Signed)
Pt states he started having abdominal pain a few days ago but pain has increased; pt states pain at 9/10 on arrival. Pt denies n/v; Pt states pain stays in mid abdomen; Pt a&ox 4 on arrival.

## 2016-04-10 NOTE — ED Provider Notes (Signed)
[facility]-EMERGENCY DEPT Provider Note   CSN: [id number] Arrival date & time: 04/10/16  2116     History   Chief Complaint Chief Complaint  Patient presents with  . Abdominal Pain    HPI Marc Miranda is a 55 y.o. male.  The history is provided by the patient.  Abdominal Pain   This is a new problem. The current episode started more than 2 days ago. The problem occurs daily. The problem has been gradually worsening. The pain is associated with eating. The pain is located in the generalized abdominal region. The quality of the pain is aching (bloating). The pain is at a severity of 5/10. The pain is mild. Associated symptoms include constipation (chronic). Pertinent negatives include fever, nausea, vomiting, dysuria, hematuria and arthralgias. Nothing aggravates the symptoms. The symptoms are relieved by bowel activity. Past workup includes ultrasound. Past workup does not include GI consult, CT scan, surgery or barium enema.    Past Medical History:  Diagnosis Date  . Asthma   . Diabetes mellitus without complication [facility](HCC)     Patient Active Problem List   Diagnosis Date Noted  . Diabetic foot ulcer with osteomyelitis (HCC) 12/03/2014  . Cellulitis 12/03/2014  . Type 2 diabetes mellitus (HCC) 12/03/2014  . Essential hypertension 12/03/2014  . History of amputation 12/03/2014    Past Surgical History:  Procedure Laterality Date  . AMPUTATION Left 12/04/2014   Procedure: tRANSMETATARSAL LEFT FOOT;  Surgeon: Nadara MustardMarcus Duda V, MD;  Location: [facility] OR;  Service: Orthopedics;  Laterality: Left;  . TOE AMPUTATION         Home Medications    Prior to Admission medications   Medication Sig Start Date End Date Taking? Authorizing Provider  DULoxetine (CYMBALTA) 30 MG capsule Take 30 mg by mouth daily as needed for pain. 02/10/16  Yes Historical Provider, MD  glimepiride (AMARYL) 2 MG tablet Take 2 mg by mouth daily. 11/23/14  Yes Historical Provider, MD  HYDROcodone-acetaminophen  (NORCO) 10-325 MG per tablet Take 1 tablet by mouth every 6 (six) hours as needed (for pain).  12/01/14  Yes Historical Provider, MD  insulin aspart (NOVOLOG FLEXPEN) 100 UNIT/ML FlexPen Inject 5-7 Units into the skin 3 (three) times daily before meals. AS NEEDED/PER SLIDING SCALE   Yes Historical Provider, MD  lisinopril (PRINIVIL,ZESTRIL) 10 MG tablet Take 10 mg by mouth daily.   Yes Historical Provider, MD  metFORMIN (GLUCOPHAGE) 1000 MG tablet Take 1,000 mg by mouth 2 (two) times daily with a meal.   Yes Historical Provider, MD  montelukast (SINGULAIR) 10 MG tablet Take 10 mg by mouth daily as needed for allergies. 02/25/16  Yes Historical Provider, MD  omeprazole (PRILOSEC) 40 MG capsule Take 40 mg by mouth daily. 09/26/13  Yes Historical Provider, MD  saxagliptin HCl (ONGLYZA) 5 MG TABS tablet Take 5 mg by mouth daily.   Yes Historical Provider, MD  TOUJEO SOLOSTAR 300 UNIT/ML SOPN Inject 60 Units into the skin at bedtime. 02/29/16  Yes Historical Provider, MD  ibuprofen (ADVIL,MOTRIN) 800 MG tablet Take 1 tablet (800 mg total) by mouth 3 (three) times daily. Patient not taking: Reported on 04/10/2016 10/15/14   Teressa LowerVrinda Pickering, NP    Family History No family history on file.  Social History Social History  Substance Use Topics  . Smoking status: Current Every Day Smoker    Packs/day: 0.50    Years: 20.00    Types: Cigarettes  . Smokeless tobacco: Never Used     Comment: uses e cigarette  as needed  . Alcohol use No     Allergies   Patient has no known allergies.   Review of Systems Review of Systems  Constitutional: Negative for chills and fever.  HENT: Negative for ear pain and sore throat.   Eyes: Negative for pain and visual disturbance.  Respiratory: Negative for cough and shortness of breath.   Cardiovascular: Negative for chest pain and palpitations.  Gastrointestinal: Positive for abdominal pain and constipation (chronic). Negative for nausea and vomiting.    Genitourinary: Negative for dysuria and hematuria.  Musculoskeletal: Negative for arthralgias and back pain.  Skin: Negative for color change and rash.  Neurological: Negative for seizures and syncope.  All other systems reviewed and are negative.    Physical Exam Updated Vital Signs BP 121/80   Pulse 84   Temp 97.8 F (36.6 C) (Oral)   Resp 10   Ht 6\' 4"  (1.93 m)   Wt 129.3 kg   SpO2 97%   BMI 34.69 kg/m   Physical Exam  Constitutional: He appears well-developed and well-nourished.  HENT:  Head: Normocephalic and atraumatic.  Eyes: Conjunctivae are normal.  Neck: Neck supple.  Cardiovascular: Normal rate and regular rhythm.   No murmur heard. Pulmonary/Chest: Effort normal and breath sounds normal. No respiratory distress.  Abdominal: Soft. Bowel sounds are normal. He exhibits no distension. There is generalized tenderness and tenderness in the epigastric area. There is no rigidity, no rebound, no guarding, no tenderness at McBurney's point and negative Murphy's sign.  Musculoskeletal: He exhibits no edema.  Neurological: He is alert.  Skin: Skin is warm and dry.  Psychiatric: He has a normal mood and affect.  Nursing note and vitals reviewed.    ED Treatments / Results  Labs (all labs ordered are listed, but only abnormal results are displayed) Labs Reviewed  LIPASE, BLOOD - Abnormal; Notable for the following:       Result Value   Lipase 130 (*)    All other components within normal limits  COMPREHENSIVE METABOLIC PANEL - Abnormal; Notable for the following:    Sodium 131 (*)    Chloride 99 (*)    Glucose, Bld 187 (*)    Creatinine, Ser 1.28 (*)    ALT 14 (*)    Total Bilirubin 0.2 (*)    All other components within normal limits  CBC - Abnormal; Notable for the following:    RBC 5.96 (*)    MCV 68.1 (*)    MCH 22.5 (*)    All other components within normal limits  URINALYSIS, ROUTINE W REFLEX MICROSCOPIC - Abnormal; Notable for the following:    Hgb  urine dipstick SMALL (*)    Protein, ur 100 (*)    Squamous Epithelial / LPF 0-5 (*)    All other components within normal limits    EKG  EKG Interpretation None       Radiology No results found.  Procedures Procedures (including critical care time)  Medications Ordered in ED Medications  polyethylene glycol (MIRALAX / GLYCOLAX) packet 51 g (51 g Oral Given 04/10/16 2346)  oxyCODONE (Oxy IR/ROXICODONE) immediate release tablet 5 mg (not administered)  sodium chloride 0.9 % bolus 1,000 mL (1,000 mLs Intravenous New Bag/Given 04/10/16 2335)  ondansetron (ZOFRAN) injection 4 mg (4 mg Intravenous Given 04/10/16 2335)  acetaminophen (TYLENOL) tablet 1,000 mg (1,000 mg Oral Given 04/10/16 2346)     Initial Impression / Assessment and Plan / ED Course  I have reviewed the triage vital signs  and the nursing notes.  Pertinent labs & imaging results that were available during my care of the patient were reviewed by me and considered in my medical decision making (see chart for details).  Clinical Course     55 year old male comes with diffuse abdominal pain the last several days. Says it's worse after meals and is relieved with bowel movements. Chronically constipated only goes every 3-4 days. He is to strain to get his stools out. No nausea no vomiting. Abdomen soft tender in epigastrium and left abdomen. Signs of peritonitis on exam. Vital signs are stable he's afebrile. Laboratory testing shows elevation of lipase to 130 abnormal liver function kidney functionsignificant electrolyte derangements. Patient has likely mild pancreatitis however is tolerating by mouth without any difficulty is also probably chronically constipated no need for further imaging at this point. He has had a right upper quadrant ultrasound done 3 days ago that showed no sludge or stones. Patient is given symptomatic control with IV fluids pain medication as well as MiraLAX for bowel cleanout. Patient agrees this plan  sugars are well-controlled and he will continue to monitor them and follow-up with his primary care providers this continues to be a problem.   Final Clinical Impressions(s) / ED Diagnoses   Final diagnoses:  Epigastric pain  Acute pancreatitis, unspecified complication status, unspecified pancreatitis type    New Prescriptions New Prescriptions   No medications on file     Cherlynn Perches, MD 04/10/16 2354    Margarita Grizzle, MD 04/12/16 4098

## 2016-04-10 NOTE — ED Notes (Signed)
ED Provider at bedside. 

## 2016-04-11 MED ORDER — ONDANSETRON 4 MG PO TBDP
4.0000 mg | ORAL_TABLET | Freq: Three times a day (TID) | ORAL | 0 refills | Status: AC | PRN
Start: 2016-04-11 — End: ?

## 2018-05-16 IMAGING — US US ABDOMEN COMPLETE
2 series · 13 of 25 positions shown · non-contrast
Comparison: None.

CLINICAL DATA: Left upper quadrant abdominal pain for past 3 days

EXAM:
ABDOMEN ULTRASOUND COMPLETE

[Series 1: us abdomen complete · 0.28mm/px · 6 of 41 slices shown (1 of 2)]
[im 1/41]
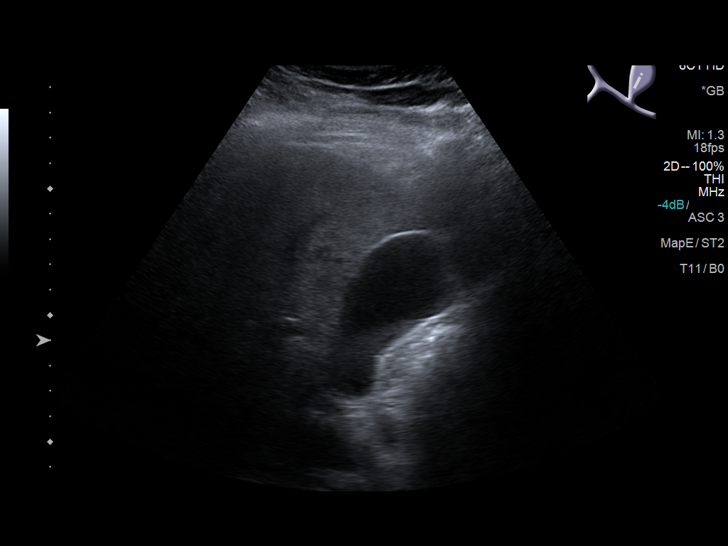
[im 9/41]
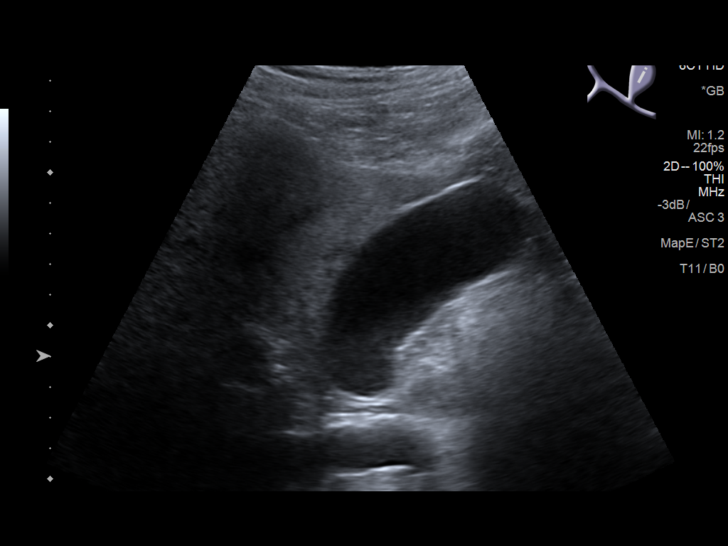
[im 17/41]
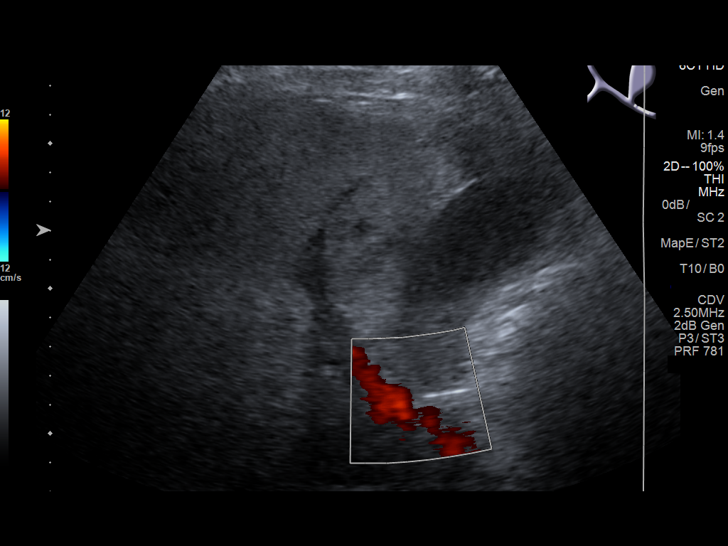
[im 25/41]
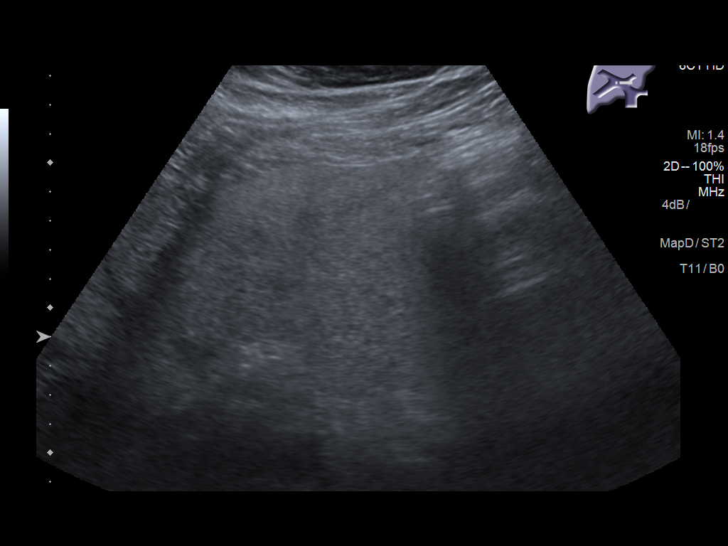
[im 33/41]
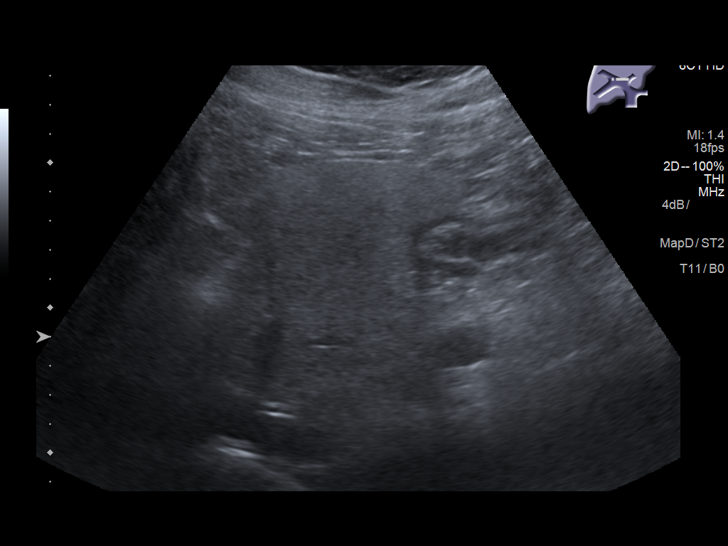
[im 41/41]
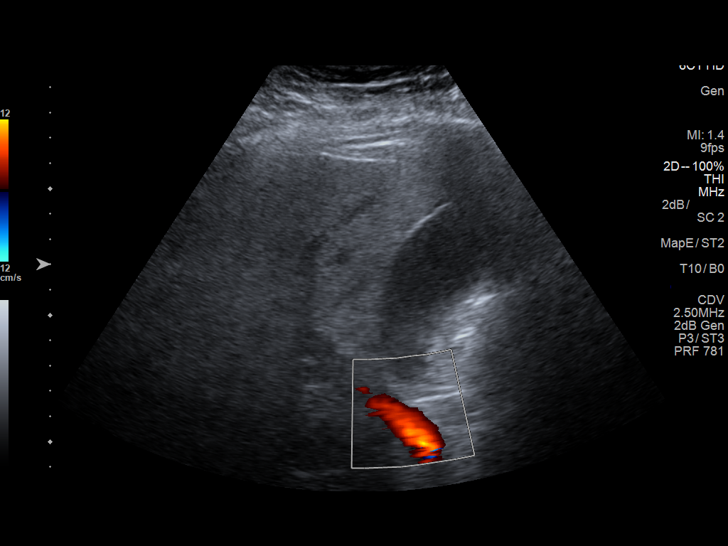

[Series 2001: us abdomen complete · 0.28mm/px · 7 of 54 slices shown (2 of 2)]
[im 5/54]
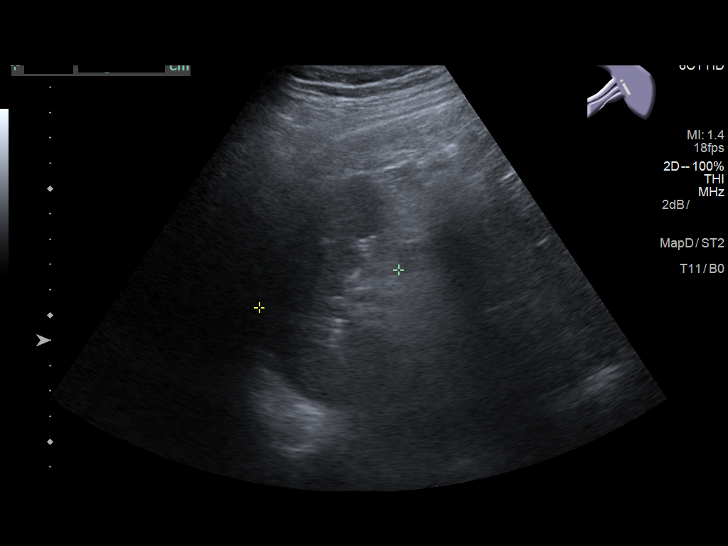
[im 13/54]
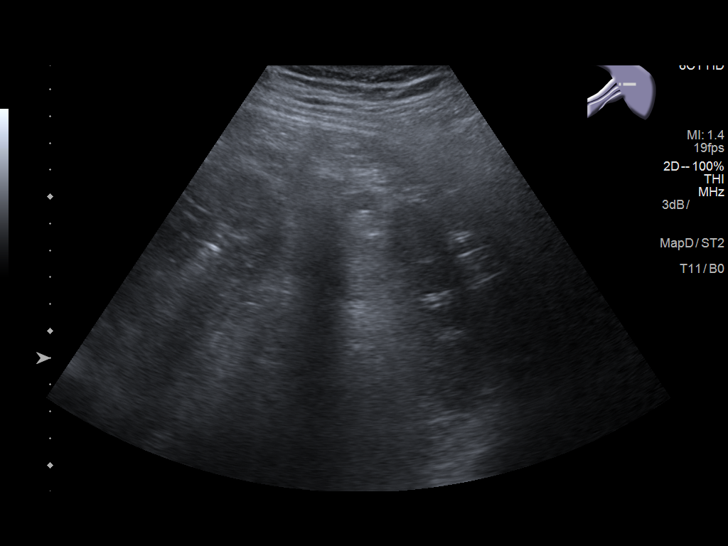
[im 21/54]
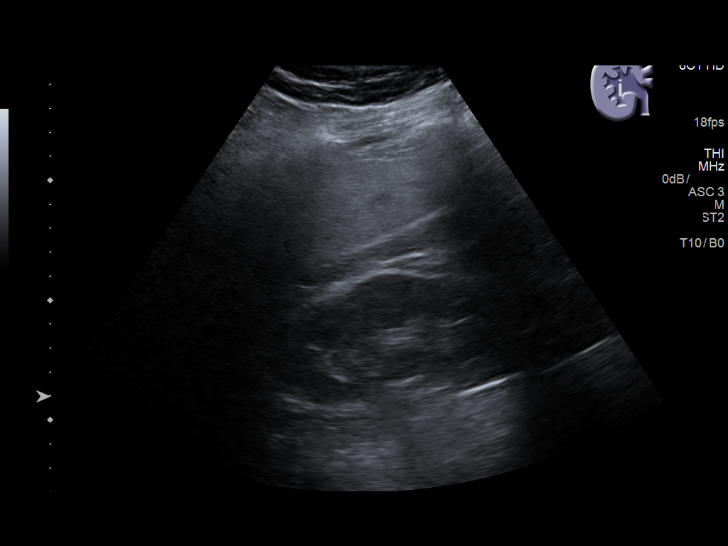
[im 29/54]
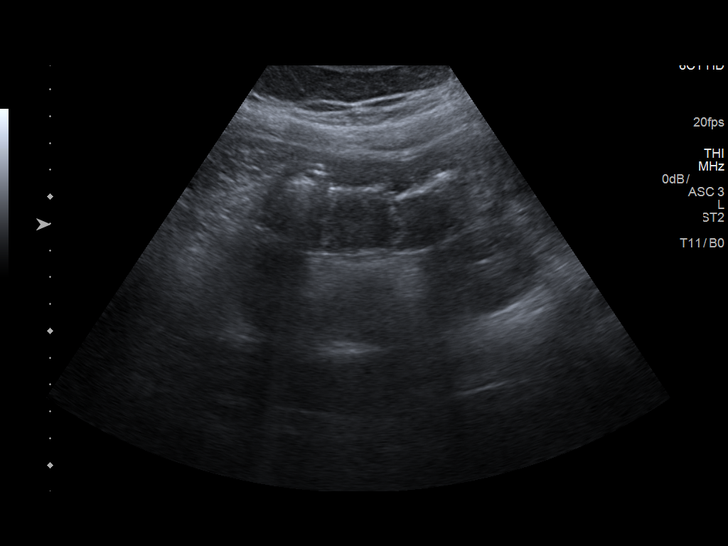
[im 37/54]
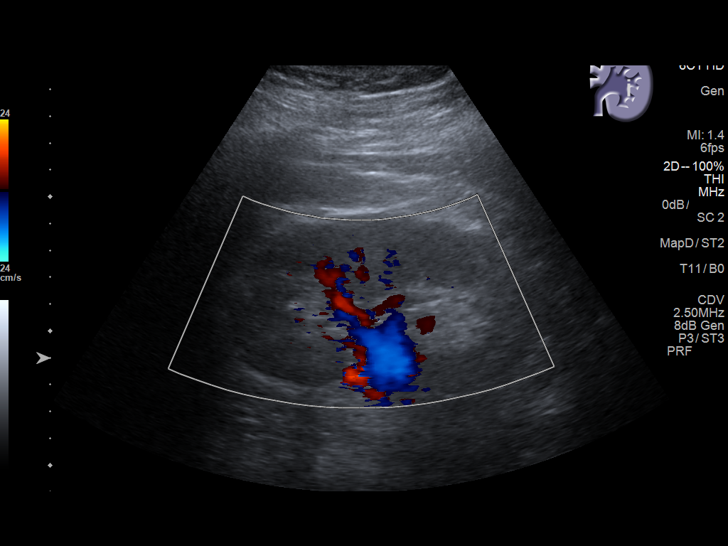
[im 45/54]
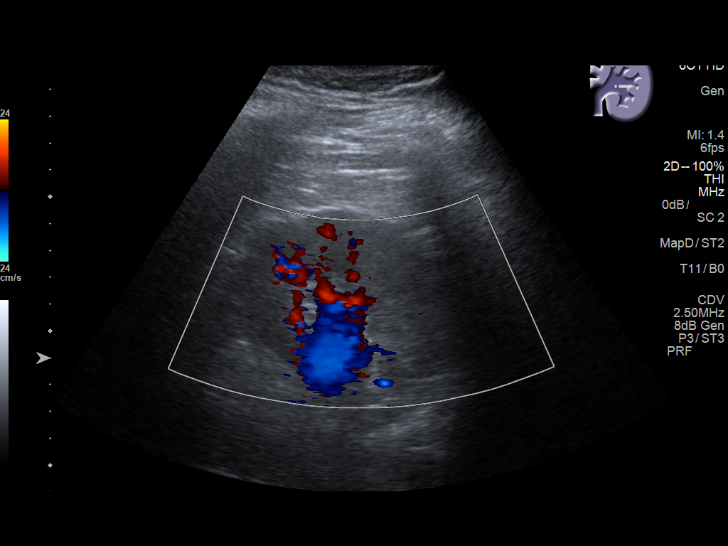
[im 54/54]
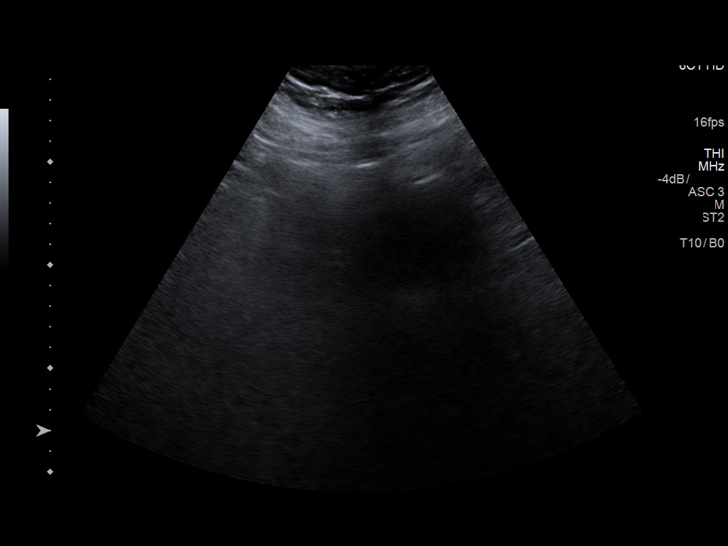

[13 of 25 positions shown; findings below may reference images not displayed]

FINDINGS: Gallbladder: No gallstones or wall thickening visualized.
Gallbladder wall is 2.4 mm in thickness. No sonographic Murphy sign
noted by sonographer.

Common bile duct: Diameter: 3.3 mm and normal. No
choledocholithiasis.

Liver: No focal lesion identified. Echogenic consistent with hepatic
steatosis.

IVC: No abnormality visualized.

Pancreas: Not visualized due to overlying bowel gas.

Spleen: Size and appearance within normal limits. Maximum sagittal
length approximately 9.4 cm.

Right Kidney: Length: 11.5 cm. Echogenicity within normal limits. No
mass or hydronephrosis visualized.

Left Kidney: Length: 11.7 cm. Echogenicity within normal limits. No
mass or hydronephrosis visualized.

Abdominal aorta: The proximal abdominal aorta is not aneurysmal. The
mid to distal aorta and bifurcation were obscured by bowel gas.

Other findings: Patent main portal vein.
IMPRESSION: Fatty appearing liver. No sonographic findings for the patient's
left upper quadrant pain.

## 2019-05-01 ENCOUNTER — Ambulatory Visit: Payer: PRIVATE HEALTH INSURANCE | Attending: Internal Medicine

## 2019-05-01 DIAGNOSIS — R238 Other skin changes: Secondary | ICD-10-CM

## 2019-05-01 DIAGNOSIS — U071 COVID-19: Secondary | ICD-10-CM

## 2019-05-02 LAB — NOVEL CORONAVIRUS, NAA: SARS-CoV-2, NAA: NOT DETECTED

## 2019-06-18 ENCOUNTER — Ambulatory Visit: Payer: PRIVATE HEALTH INSURANCE | Attending: Internal Medicine

## 2019-06-18 ENCOUNTER — Other Ambulatory Visit: Payer: PRIVATE HEALTH INSURANCE

## 2019-06-18 DIAGNOSIS — Z20822 Contact with and (suspected) exposure to covid-19: Secondary | ICD-10-CM

## 2019-06-19 LAB — NOVEL CORONAVIRUS, NAA: SARS-CoV-2, NAA: NOT DETECTED

## 2021-06-15 ENCOUNTER — Emergency Department (HOSPITAL_BASED_OUTPATIENT_CLINIC_OR_DEPARTMENT_OTHER): Payer: Medicaid - Out of State

## 2021-06-15 ENCOUNTER — Other Ambulatory Visit: Payer: Self-pay

## 2021-06-15 ENCOUNTER — Encounter (HOSPITAL_COMMUNITY): Payer: Self-pay

## 2021-06-15 ENCOUNTER — Emergency Department (HOSPITAL_COMMUNITY)
Admission: EM | Admit: 2021-06-15 | Discharge: 2021-06-15 | Disposition: A | Discharge status: Home / Self Care | Payer: Medicaid - Out of State | Attending: Emergency Medicine | Admitting: Emergency Medicine

## 2021-06-15 ENCOUNTER — Emergency Department (HOSPITAL_COMMUNITY): Payer: Medicaid - Out of State

## 2021-06-15 DIAGNOSIS — E11621 Type 2 diabetes mellitus with foot ulcer: Secondary | ICD-10-CM | POA: Insufficient documentation

## 2021-06-15 DIAGNOSIS — X58XXXA Exposure to other specified factors, initial encounter: Secondary | ICD-10-CM | POA: Insufficient documentation

## 2021-06-15 DIAGNOSIS — M79604 Pain in right leg: Secondary | ICD-10-CM

## 2021-06-15 DIAGNOSIS — S91301A Unspecified open wound, right foot, initial encounter: Secondary | ICD-10-CM | POA: Insufficient documentation

## 2021-06-15 DIAGNOSIS — L97519 Non-pressure chronic ulcer of other part of right foot with unspecified severity: Secondary | ICD-10-CM | POA: Insufficient documentation

## 2021-06-15 DIAGNOSIS — E11628 Type 2 diabetes mellitus with other skin complications: Secondary | ICD-10-CM

## 2021-06-15 DIAGNOSIS — R2241 Localized swelling, mass and lump, right lower limb: Secondary | ICD-10-CM | POA: Insufficient documentation

## 2021-06-15 DIAGNOSIS — Z79899 Other long term (current) drug therapy: Secondary | ICD-10-CM | POA: Insufficient documentation

## 2021-06-15 DIAGNOSIS — Z794 Long term (current) use of insulin: Secondary | ICD-10-CM | POA: Diagnosis not present

## 2021-06-15 DIAGNOSIS — L089 Local infection of the skin and subcutaneous tissue, unspecified: Secondary | ICD-10-CM

## 2021-06-15 DIAGNOSIS — Z7984 Long term (current) use of oral hypoglycemic drugs: Secondary | ICD-10-CM | POA: Diagnosis not present

## 2021-06-15 LAB — CBC WITH DIFFERENTIAL/PLATELET
Abs Immature Granulocytes: 0.03 10*3/uL (ref 0.00–0.07)
Basophils Absolute: 0.1 10*3/uL (ref 0.0–0.1)
Basophils Relative: 1 %
Eosinophils Absolute: 0.2 10*3/uL (ref 0.0–0.5)
Eosinophils Relative: 3 %
HCT: 49.4 % (ref 39.0–52.0)
Hemoglobin: 14.8 g/dL (ref 13.0–17.0)
Immature Granulocytes: 1 %
Lymphocytes Relative: 35 %
Lymphs Abs: 2.2 10*3/uL (ref 0.7–4.0)
MCH: 22.1 pg — ABNORMAL LOW (ref 26.0–34.0)
MCHC: 30 g/dL (ref 30.0–36.0)
MCV: 73.7 fL — ABNORMAL LOW (ref 80.0–100.0)
Monocytes Absolute: 0.7 10*3/uL (ref 0.1–1.0)
Monocytes Relative: 11 %
Neutro Abs: 3.1 10*3/uL (ref 1.7–7.7)
Neutrophils Relative %: 49 %
Platelets: 167 10*3/uL (ref 150–400)
RBC: 6.7 MIL/uL — ABNORMAL HIGH (ref 4.22–5.81)
RDW: 18.3 % — ABNORMAL HIGH (ref 11.5–15.5)
WBC: 6.3 10*3/uL (ref 4.0–10.5)
nRBC: 0 % (ref 0.0–0.2)

## 2021-06-15 LAB — COMPREHENSIVE METABOLIC PANEL
ALT: 31 U/L (ref 0–44)
AST: 21 U/L (ref 15–41)
Albumin: 3.4 g/dL — ABNORMAL LOW (ref 3.5–5.0)
Alkaline Phosphatase: 81 U/L (ref 38–126)
Anion gap: 6 (ref 5–15)
BUN: 16 mg/dL (ref 6–20)
CO2: 28 mmol/L (ref 22–32)
Calcium: 9.4 mg/dL (ref 8.9–10.3)
Chloride: 105 mmol/L (ref 98–111)
Creatinine, Ser: 1.12 mg/dL (ref 0.61–1.24)
GFR, Estimated: >60 mL/min (ref >60)
Glucose, Bld: 199 mg/dL — ABNORMAL HIGH (ref 70–99)
Potassium: 4.8 mmol/L (ref 3.5–5.1)
Sodium: 139 mmol/L (ref 135–145)
Total Bilirubin: 0.4 mg/dL (ref 0.3–1.2)
Total Protein: 6.7 g/dL (ref 6.5–8.1)

## 2021-06-15 LAB — CBG MONITORING, ED: Glucose-Capillary: 177 mg/dL — ABNORMAL HIGH (ref 70–99)

## 2021-06-15 LAB — LACTIC ACID, PLASMA: Lactic Acid, Venous: 1.2 mmol/L (ref 0.5–1.9)

## 2021-06-15 MED ORDER — OXYCODONE-ACETAMINOPHEN 5-325 MG PO TABS
2.0000 | ORAL_TABLET | Freq: Once | ORAL | Status: AC
  Administered 2021-06-15: 2 via ORAL
  Filled 2021-06-15: qty 2

## 2021-06-15 MED ORDER — DOXYCYCLINE HYCLATE 100 MG PO CAPS: 100.0000 mg | ORAL_CAPSULE | Freq: Two times a day (BID) | ORAL | 0 refills | Status: AC

## 2021-06-15 MED ORDER — DOXYCYCLINE HYCLATE 100 MG PO CAPS: 100.0000 mg | ORAL_CAPSULE | Freq: Two times a day (BID) | ORAL | 0 refills | Status: DC

## 2021-06-15 NOTE — ED Triage Notes (Signed)
Patient complains of right lower leg swelling and wound to right foot for 4 weeks. Redness and tenderness to same. Patient is diabetic and wound has not been checked by MD

## 2021-06-15 NOTE — ED Provider Notes (Signed)
[facility] DE[facility]MENT Provider Note   CSN: [id number] Arrival date & time: 06/15/21  1038     History  Chief Complaint  Patient presents with   leg wound     HPI  Patient is a 61 year old male with history of diabetes who presents today with a wound to his right foot.  Wound present to the top of the right foot that is scabbing.  He states that has been present for multiple weeks.  He has associated pain with it.  He also has noted that his right leg seems to be swelling.  Denies any drainage from the wound.  Denies any injuries that he knows of.  He has had multiple amputations in the past secondary to poor wound healing.  Currently states he has mild to moderate pain in the foot.  He does report having a recent long car trip, but is unsure if the leg swelling started before or after.     Home Medications Prior to Admission medications   Medication Sig Start Date End Date Taking? Authorizing Provider  doxycycline (VIBRAMYCIN) 100 MG capsule Take 1 capsule (100 mg total) by mouth 2 (two) times daily for 14 days. 06/15/21 06/29/21  Jacelyn Pi, MD  DULoxetine (CYMBALTA) 30 MG capsule Take 30 mg by mouth daily as needed for pain. 02/10/16   [provider]  glimepiride (AMARYL) 2 MG tablet Take 2 mg by mouth daily. 11/23/14   [provider]  HYDROcodone-acetaminophen (NORCO) 10-325 MG per tablet Take 1 tablet by mouth every 6 (six) hours as needed (for pain).  12/01/14   [provider]  ibuprofen (ADVIL,MOTRIN) 800 MG tablet Take 1 tablet (800 mg total) by mouth 3 (three) times daily. Patient not taking: Reported on 04/10/2016 10/15/14   Glendell Docker, NP  insulin aspart (NOVOLOG FLEXPEN) 100 UNIT/ML FlexPen Inject 5-7 Units into the skin 3 (three) times daily before meals. AS NEEDED/PER SLIDING SCALE    [provider]  lisinopril (PRINIVIL,ZESTRIL) 10 MG tablet Take 10 mg by mouth daily.    [provider]   metFORMIN (GLUCOPHAGE) 1000 MG tablet Take 1,000 mg by mouth 2 (two) times daily with a meal.    [provider]  montelukast (SINGULAIR) 10 MG tablet Take 10 mg by mouth daily as needed for allergies. 02/25/16   [provider]  omeprazole (PRILOSEC) 40 MG capsule Take 40 mg by mouth daily. 09/26/13   [provider]  ondansetron (ZOFRAN ODT) 4 MG disintegrating tablet Take 1 tablet (4 mg total) by mouth every 8 (eight) hours as needed for nausea or vomiting. 04/11/16   Breck Coons, MD  saxagliptin HCl (ONGLYZA) 5 MG TABS tablet Take 5 mg by mouth daily.    [provider]  TOUJEO SOLOSTAR 300 UNIT/ML SOPN Inject 60 Units into the skin at bedtime. 02/29/16   [provider]      Allergies    Patient has no known allergies.    Review of Systems   Review of Systems  Constitutional:  Negative for chills and fever.  HENT:  Negative for ear pain and sore throat.   Eyes:  Negative for pain and visual disturbance.  Respiratory:  Negative for cough and shortness of breath.   Cardiovascular:  Positive for leg swelling. Negative for chest pain and palpitations.  Gastrointestinal:  Negative for abdominal pain and vomiting.  Genitourinary:  Negative for dysuria and hematuria.  Musculoskeletal:  Negative for arthralgias and back pain.  Right foot pain  Skin:  Negative for color change and rash.       Right foot wound  Neurological:  Negative for seizures and syncope.  All other systems reviewed and are negative.  Physical Exam Updated Vital Signs BP 127/84 (BP Location: Right Arm)    Pulse 76    Temp 98.3 F (36.8 C) (Oral)    Resp 16    Ht 6\' 4"  (1.93 m)    Wt 129.3 kg    SpO2 97%    BMI 34.69 kg/m  Physical Exam Vitals and nursing note reviewed.  Constitutional:      General: He is not in acute distress.    Appearance: He is well-developed. He is obese.  HENT:     Head: Normocephalic and atraumatic.  Eyes:     Conjunctiva/sclera:  Conjunctivae normal.  Cardiovascular:     Rate and Rhythm: Normal rate and regular rhythm.     Heart sounds: No murmur heard. Pulmonary:     Effort: Pulmonary effort is normal. No respiratory distress.     Breath sounds: Normal breath sounds.  Abdominal:     Palpations: Abdomen is soft.     Tenderness: There is no abdominal tenderness.  Musculoskeletal:        General: No swelling.     Cervical back: Neck supple.     Right lower leg: Edema present.     Comments: Quarter sized scabbed wound to the dorsum of the right foot.  Mild surrounding erythema but no surrounding warmth.  No fluctuance.  No drainage.  Mild tenderness palpation in the area.  Intact dorsalis pedis pulse in the foot.  He does have edema spanning up to the calf of the right leg.  Skin:    General: Skin is warm and dry.     Capillary Refill: Capillary refill takes less than 2 seconds.  Neurological:     Mental Status: He is alert.  Psychiatric:        Mood and Affect: Mood normal.    ED Results / Procedures / Treatments   Labs (all labs ordered are listed, but only abnormal results are displayed) Labs Reviewed  COMPREHENSIVE METABOLIC PANEL - Abnormal; Notable for the following components:      Result Value   Glucose, Bld 199 (*)    Albumin 3.4 (*)    All other components within normal limits  CBC WITH DIFFERENTIAL/PLATELET - Abnormal; Notable for the following components:   RBC 6.70 (*)    MCV 73.7 (*)    MCH 22.1 (*)    RDW 18.3 (*)    All other components within normal limits  CBG MONITORING, ED - Abnormal; Notable for the following components:   Glucose-Capillary 177 (*)    All other components within normal limits  LACTIC ACID, PLASMA  URINALYSIS, ROUTINE W REFLEX MICROSCOPIC    EKG None  Radiology DG Foot Complete Right  Result Date: 06/15/2021 CLINICAL DATA:  Right foot wound for past 2-3 weeks in a non diabetic patient EXAM: RIGHT FOOT COMPLETE - 3+ VIEW COMPARISON:  None. FINDINGS:  Postsurgical changes for prior amputation through the distal aspect of the first proximal phalanx, second metatarsophalangeal joint as well as distal phalanx of the third digit. No cortical erosion or periosteal reaction concerning for osteomyelitis. Arthritic changes at the tibiotalar and talonavicular joints. Prominent plantar calcaneal spurring and mild Achilles enthesopathy. Soft tissue swelling about the distal aspect of the foot. IMPRESSION: 1. Postsurgical changes without evidence of cortical  erosion or periosteal reaction concerning for osteomyelitis. Evaluation of early osteomyelitis is however limited on radiographs, MRI examination could be considered for further evaluation if clinically warranted. 2. Soft tissue swelling about the distal aspect of the foot. Electronically Signed   By: Keane Police D.O.   On: 06/15/2021 12:16   VAS [place] LOWER EXTREMITY VENOUS (DVT) (ONLY [facility] & [facility])  Result Date: 06/15/2021  Lower Venous DVT Study Patient Name:  [facility]  Date of Exam:   06/15/2021 Medical Rec #: [id number]      Accession #:    [id number] Date of Birth: May 06, 1960      Patient Gender: M Patient Age:   61 years Exam Location:  [facility] Procedure:      VAS [place] LOWER EXTREMITY VENOUS (DVT) Referring Phys: JON KNAPP --------------------------------------------------------------------------------  Indications: Pain.  Comparison Study: No previous exams Performing Technologist: Jody Hill RVT, RDMS  Examination Guidelines: A complete evaluation includes B-mode imaging, spectral Doppler, color Doppler, and power Doppler as needed of all accessible portions of each vessel. Bilateral testing is considered an integral part of a complete examination. Limited examinations for reoccurring indications may be performed as noted. The reflux portion of the exam is performed with the patient in reverse Trendelenburg.  +---------+---------------+---------+-----------+----------+--------------+  RIGHT      Compressibility Phasicity Spontaneity Properties Thrombus Aging  +---------+---------------+---------+-----------+----------+--------------+  CFV       Full            Yes       Yes                                    +---------+---------------+---------+-----------+----------+--------------+  SFJ       Full                                                             +---------+---------------+---------+-----------+----------+--------------+  FV Prox   Full            Yes       Yes                                    +---------+---------------+---------+-----------+----------+--------------+  FV Mid    Full            Yes       Yes                                    +---------+---------------+---------+-----------+----------+--------------+  FV Distal Full            Yes       Yes                                    +---------+---------------+---------+-----------+----------+--------------+  PFV       Full                                                             +---------+---------------+---------+-----------+----------+--------------+  POP       Full            Yes       Yes                                    +---------+---------------+---------+-----------+----------+--------------+  PTV       Full                                                             +---------+---------------+---------+-----------+----------+--------------+  PERO      Full                                                             +---------+---------------+---------+-----------+----------+--------------+   +----+---------------+---------+-----------+----------+--------------+  LEFT Compressibility Phasicity Spontaneity Properties Thrombus Aging  +----+---------------+---------+-----------+----------+--------------+  CFV  Full            Yes       Yes                                    +----+---------------+---------+-----------+----------+--------------+     Summary: RIGHT: - There is no evidence of deep vein thrombosis in the lower  extremity. - There is no evidence of superficial venous thrombosis.  - No cystic structure found in the popliteal fossa. - Ultrasound characteristics of enlarged lymph nodes are noted in the groin.  LEFT: - No evidence of common femoral vein obstruction.  *See table(s) above for measurements and observations.    Preliminary     Procedures Procedures    Medications Ordered in ED Medications  oxyCODONE-acetaminophen (PERCOCET/ROXICET) 5-325 MG per tablet 2 tablet (2 tablets Oral Given 06/15/21 1250)    ED Course/ Medical Decision Making/ A&P  [facility] presented today with a wound to right foot. Their presentation is complicated by their history of diabetes, prior amputations.   Differential diagnosis includes but is not limited to cellulitis, DVT  Based on the presentation, labs and imaging were ordered.   ED provider interpretation of laboratory studies: Blood work is reassuring.  No leukocytosis.  ED provider interpretation of imaging studies (imaging also reviewed and interpreted by radiology): No signs of bony erosions or fractures on x-ray.  DVT study was negative.  Decision Making: Patient here with wound of the right foot.  It appears clean and dry.  Mild surrounding erythema.  No warmth or purulence.  Concern for possible diabetic foot infection.  He also has swelling to the right leg that is asymmetric to the left.  DVT study was negative.  Patient with concern for early developing foot infection.  No leukocytosis.  Vital signs are stable.  Do not feel he requires inpatient mission for IV antibiotics.  We will plan for outpatient antibiotics.  Provided patient with resources for close outpatient follow-up.  Discussed that he is to be seen within the next week to have his foot reevaluated.  He is new to the area and plans to establish care.  We  discussed return precautions including fevers, worsening pain, worsening warmth, worsening erythema.  Patient voiced understanding and  agreement.  Patient seen in conjunction with my attending, Dr. Tomi Bamberger.  Final Clinical Impression(s) / ED Diagnoses Final diagnoses:  Diabetic foot infection ([place])    Rx / DC Orders ED Discharge Orders          Ordered    doxycycline (VIBRAMYCIN) 100 MG capsule  2 times daily,   Status:  Discontinued        06/15/21 1409    doxycycline (VIBRAMYCIN) 100 MG capsule  2 times daily        06/15/21 1427              Jacelyn Pi, MD 06/15/21 1542    Dorie Rank, MD 06/17/21 [phone]

## 2021-06-15 NOTE — Progress Notes (Signed)
RLE venous duplex has been completed.  Preliminary results given to Dr. Tomi Bamberger via secure messenger.   Results can be found under chart review under CV PROC. 06/15/2021 1:54 PM Cieanna Stormes RVT, RDMS

## 2021-06-15 NOTE — Discharge Instructions (Addendum)
Please follow up with urgent care to have foot rechecked in approximately 1 week. Return for fevers, worsening pain, worsening redness. Call the number on your paperwork for outpatient PCP

## 2021-06-15 NOTE — ED Provider Triage Note (Signed)
Emergency Medicine Provider Triage Evaluation Note  [facility] , a 61 y.o. male  was evaluated in triage.  Pt complains of right foot wound for the past two-three weeks.  Is a known diabetic.  He reports he thinks he got the wound initially from an L fitting pair of shoes.  He reports some decrease sensation although this is not new for him.  He reports he has been having some swelling to the right lower leg as well..  Review of Systems  Positive: Wound, swelling Negative: Chest pain, shortness of breath  Physical Exam  BP (!) 151/88 (BP Location: Right Arm)    Pulse 80    Temp 97.7 F (36.5 C) (Oral)    Resp 17    SpO2 97%  Gen:   Awake, no distress   Resp:  Normal effort  MSK:   Moves extremities without difficulty  Other:  Around a quarter sized scabbed ulceration noted to the dorsum of the patient's right foot.  He has palpable DP and PT pulses bilaterally.  The patient has multiple digit amputations both full and partial.  He has decreased sensation to that foot although this is nothing new for him.  Medical Decision Making  Medically screening exam initiated at 11:41 AM.  Appropriate orders placed.  [facility] was informed that the remainder of the evaluation will be completed by another provider, this initial triage assessment does not replace that evaluation, and the importance of remaining in the ED until their evaluation is complete.  Labs and imaging ordered   Achille Rich, [place] 06/15/21 1143

## 2022-10-22 IMAGING — CR DG FOOT COMPLETE 3+V*R*
3 series · 3 of 3 positions shown · non-contrast
Comparison: None.

CLINICAL DATA: Right foot wound for past 2-3 weeks in a non
diabetic patient

EXAM:
RIGHT FOOT COMPLETE - 3+ VIEW

[foot ap]
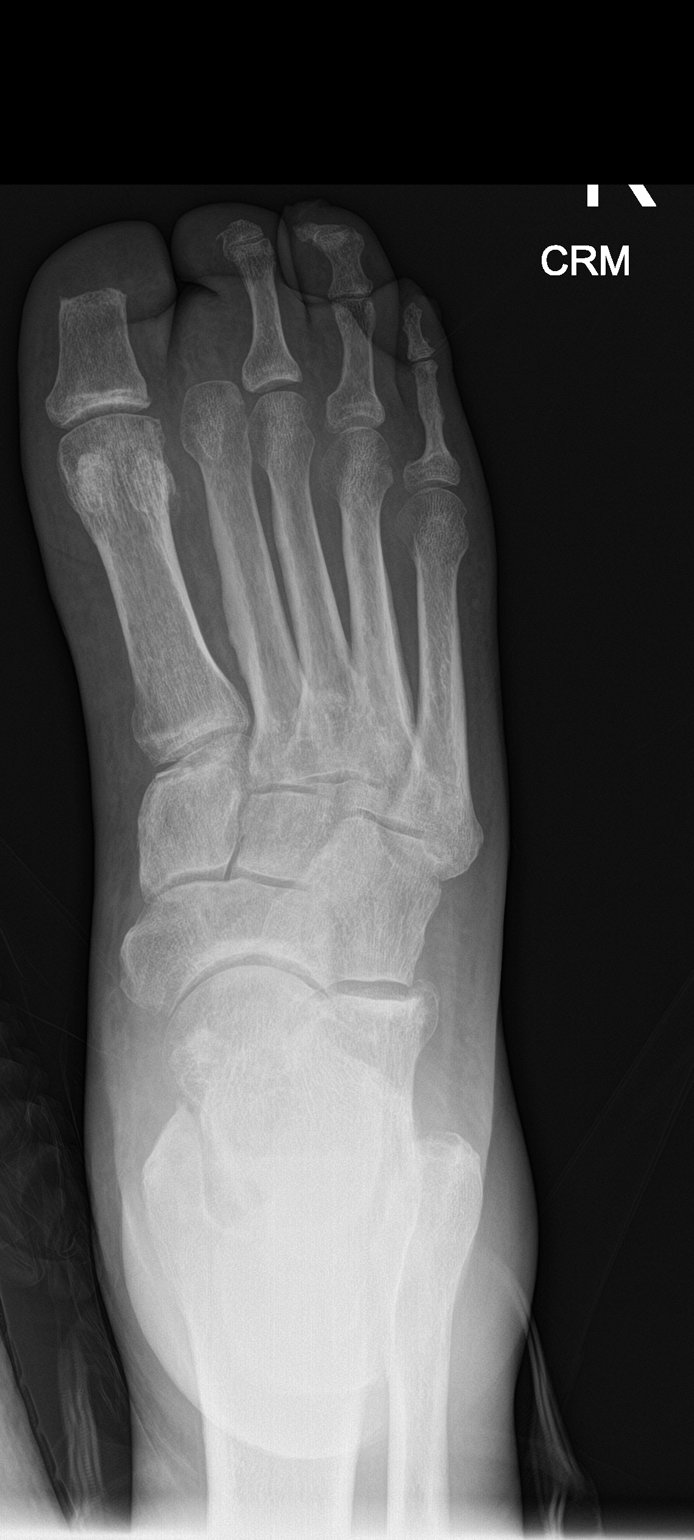

[foot obl]
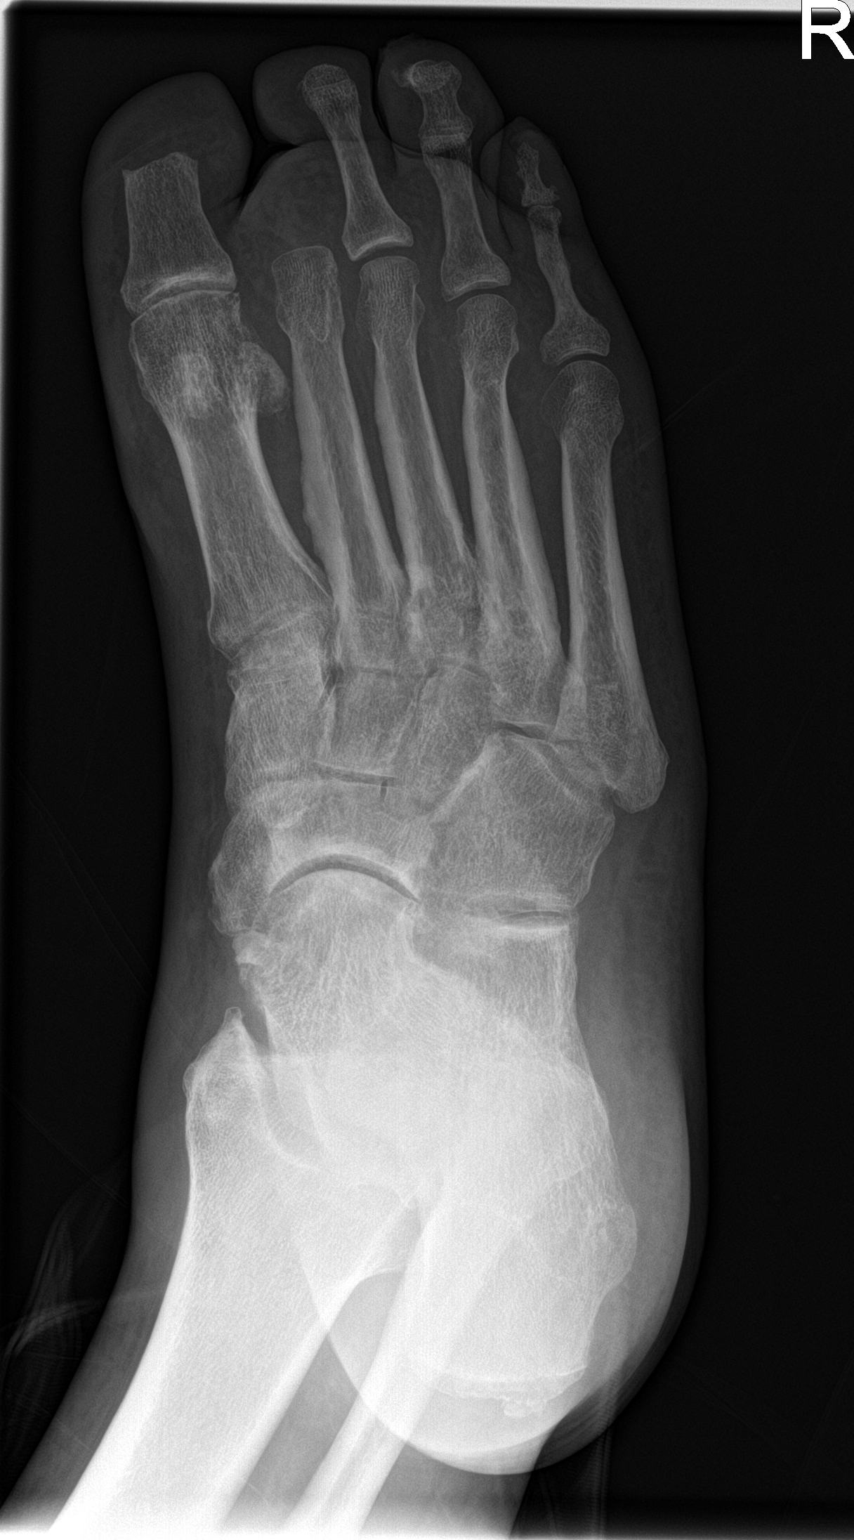

[foot lat]
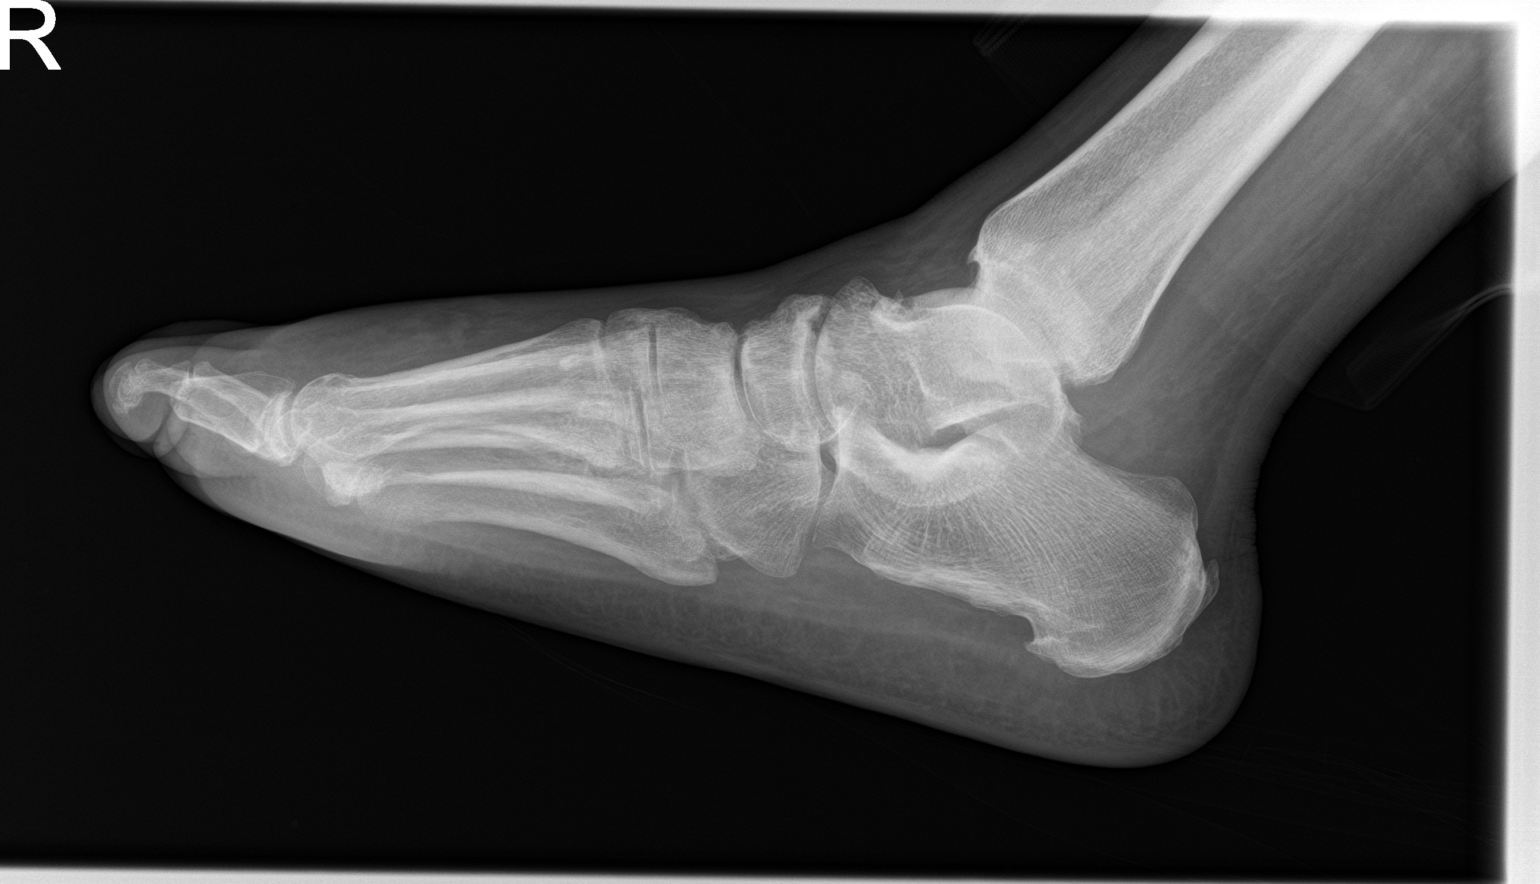

[3 of 3 positions shown; findings below may reference images not displayed]

FINDINGS: Postsurgical changes for prior amputation through the distal aspect
of the first proximal phalanx, second metatarsophalangeal joint as
well as distal phalanx of the third digit. No cortical erosion or
periosteal reaction concerning for osteomyelitis. Arthritic changes
at the tibiotalar and talonavicular joints. Prominent plantar
calcaneal spurring and mild Achilles enthesopathy. Soft tissue
swelling about the distal aspect of the foot.
IMPRESSION: 1. Postsurgical changes without evidence of cortical erosion or
periosteal reaction concerning for osteomyelitis. Evaluation of
early osteomyelitis is however limited on radiographs, MRI
examination could be considered for further evaluation if clinically
warranted.
2. Soft tissue swelling about the distal aspect of the foot.
